# Patient Record
Sex: Female | Born: 1970
Health system: Southern US, Community
[De-identification: ages and names within clinical notes are randomized; demographics above are authoritative.]

## PROBLEM LIST (undated history)

## (undated) DIAGNOSIS — K219 Gastro-esophageal reflux disease without esophagitis: Secondary | ICD-10-CM

## (undated) DIAGNOSIS — E785 Hyperlipidemia, unspecified: Secondary | ICD-10-CM

## (undated) DIAGNOSIS — N840 Polyp of corpus uteri: Secondary | ICD-10-CM

## (undated) DIAGNOSIS — I10 Essential (primary) hypertension: Secondary | ICD-10-CM

## (undated) DIAGNOSIS — D509 Iron deficiency anemia, unspecified: Secondary | ICD-10-CM

## (undated) DIAGNOSIS — D219 Benign neoplasm of connective and other soft tissue, unspecified: Secondary | ICD-10-CM

## (undated) DIAGNOSIS — D250 Submucous leiomyoma of uterus: Secondary | ICD-10-CM

## (undated) DIAGNOSIS — D649 Anemia, unspecified: Secondary | ICD-10-CM

## (undated) DIAGNOSIS — M1990 Unspecified osteoarthritis, unspecified site: Secondary | ICD-10-CM

## (undated) DIAGNOSIS — E0590 Thyrotoxicosis, unspecified without thyrotoxic crisis or storm: Secondary | ICD-10-CM

## (undated) DIAGNOSIS — R7303 Prediabetes: Secondary | ICD-10-CM

## (undated) HISTORY — DX: Essential (primary) hypertension: I10

## (undated) HISTORY — DX: Gastro-esophageal reflux disease without esophagitis: K21.9

## (undated) HISTORY — DX: Prediabetes: R73.03

## (undated) HISTORY — DX: Thyrotoxicosis, unspecified without thyrotoxic crisis or storm: E05.90

## (undated) HISTORY — DX: Benign neoplasm of connective and other soft tissue, unspecified: D21.9

## (undated) HISTORY — DX: Anemia, unspecified: D64.9

## (undated) HISTORY — DX: Hyperlipidemia, unspecified: E78.5

## (undated) HISTORY — DX: Unspecified osteoarthritis, unspecified site: M19.90

---

## 2001-04-15 ENCOUNTER — Emergency Department (HOSPITAL_COMMUNITY): Admission: EM | Admit: 2001-04-15 | Discharge: 2001-04-15 | Payer: Self-pay | Admitting: *Deleted

## 2002-04-28 ENCOUNTER — Emergency Department (HOSPITAL_COMMUNITY): Admission: EM | Admit: 2002-04-28 | Discharge: 2002-04-28 | Payer: Self-pay | Admitting: Emergency Medicine

## 2002-07-14 ENCOUNTER — Ambulatory Visit (HOSPITAL_COMMUNITY): Admission: RE | Admit: 2002-07-14 | Discharge: 2002-07-14 | Payer: Self-pay | Admitting: *Deleted

## 2002-10-13 ENCOUNTER — Inpatient Hospital Stay (HOSPITAL_COMMUNITY): Admission: AD | Admit: 2002-10-13 | Discharge: 2002-10-15 | Payer: Self-pay | Admitting: *Deleted

## 2003-12-14 ENCOUNTER — Encounter (INDEPENDENT_AMBULATORY_CARE_PROVIDER_SITE_OTHER): Payer: Self-pay | Admitting: Specialist

## 2003-12-14 ENCOUNTER — Other Ambulatory Visit: Admission: RE | Admit: 2003-12-14 | Discharge: 2003-12-14 | Payer: Self-pay | Admitting: Obstetrics and Gynecology

## 2003-12-14 ENCOUNTER — Encounter: Admission: RE | Admit: 2003-12-14 | Discharge: 2003-12-14 | Payer: Self-pay | Admitting: Family Medicine

## 2003-12-28 ENCOUNTER — Encounter: Admission: RE | Admit: 2003-12-28 | Discharge: 2003-12-28 | Payer: Self-pay | Admitting: Family Medicine

## 2004-08-19 ENCOUNTER — Emergency Department (HOSPITAL_COMMUNITY): Admission: EM | Admit: 2004-08-19 | Discharge: 2004-08-19 | Payer: Self-pay | Admitting: Emergency Medicine

## 2005-12-05 ENCOUNTER — Emergency Department (HOSPITAL_COMMUNITY): Admission: EM | Admit: 2005-12-05 | Discharge: 2005-12-05 | Payer: Self-pay | Admitting: Emergency Medicine

## 2007-11-17 ENCOUNTER — Encounter: Admission: RE | Admit: 2007-11-17 | Discharge: 2007-11-17 | Payer: Self-pay | Admitting: Gastroenterology

## 2008-12-07 ENCOUNTER — Emergency Department (HOSPITAL_COMMUNITY): Admission: EM | Admit: 2008-12-07 | Discharge: 2008-12-07 | Payer: Self-pay | Admitting: Emergency Medicine

## 2009-04-02 IMAGING — CT CT ABDOMEN W/ CM
2 of 5 series · 17 of 46 positions shown, 19 images · IV contrast (READICAT/WATER & [ID] OMNI 300)
Comparison: None

CT ABDOMEN

CLINICAL DATA: Abdominal pain

CT ABDOMEN AND PELVIS WITH CONTRAST
TECHNIQUE: Multidetector CT imaging of the abdomen and pelvis was
performed using the standard protocol following bolus
administration of intravenous contrast.
Contrast: 100 ml Cmnipaque-8TT

[Series 3: routine abdomen · axial · 0.70mm/px · z∈[-368,+17]mm · 14 of 87 slices shown, 16 images]
[im 5/87  soft-tissue]
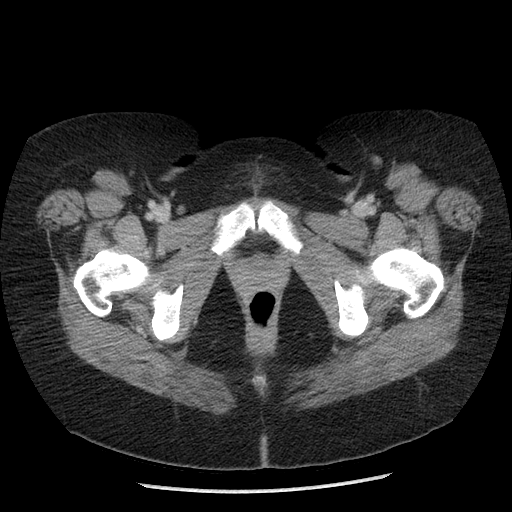
[im 5/87  bone]
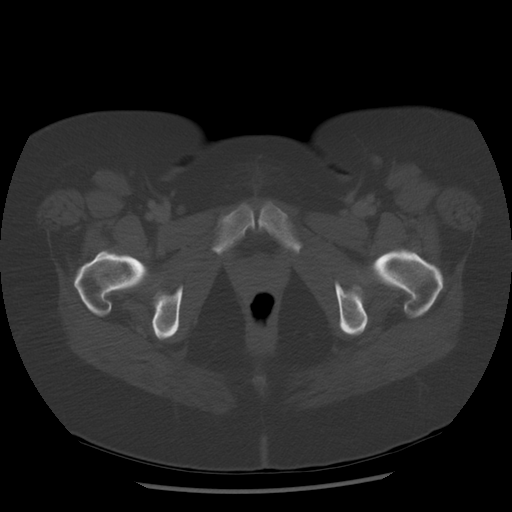
[im 10/87  soft-tissue]
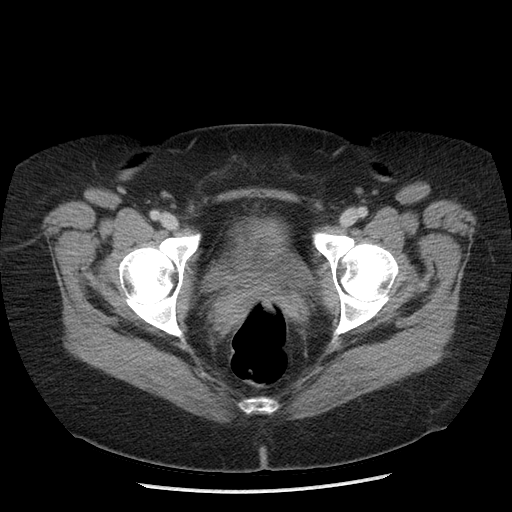
[im 20/87  soft-tissue]
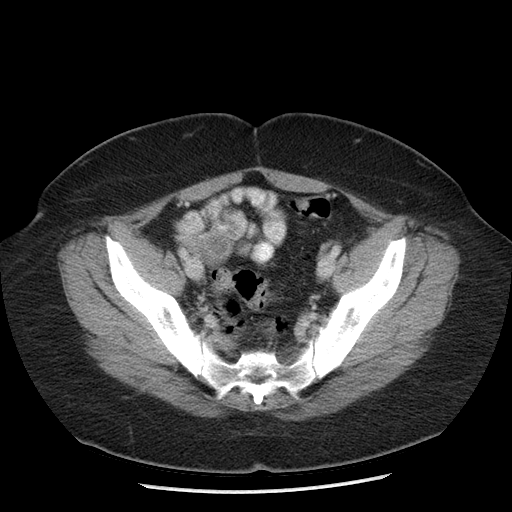
[im 24/87  soft-tissue]
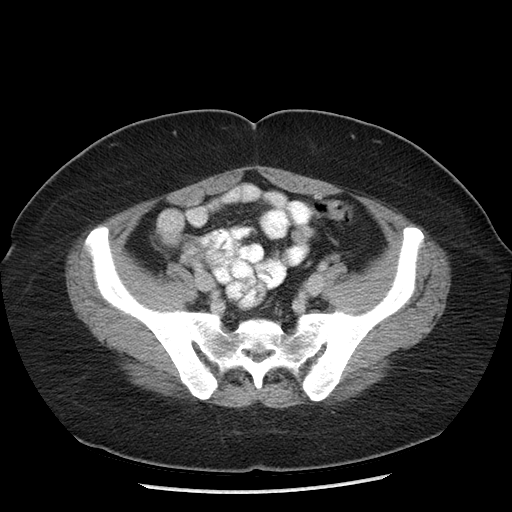
[im 29/87  soft-tissue]
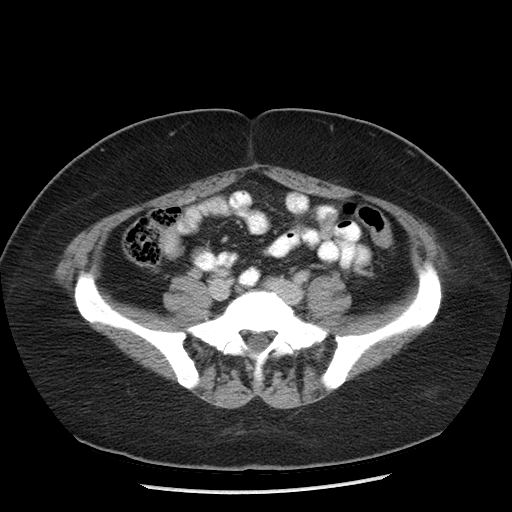
[im 34/87  soft-tissue]
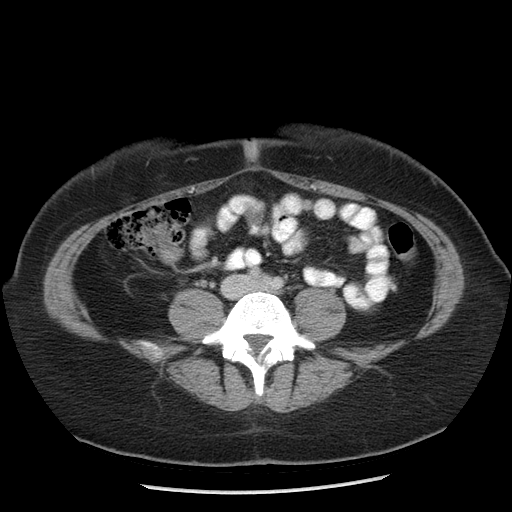
[im 39/87  soft-tissue]
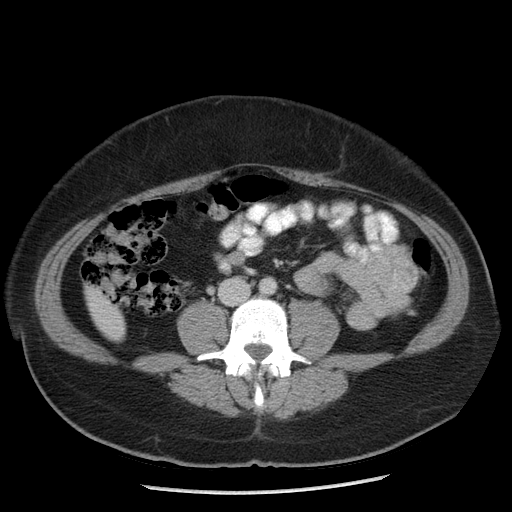
[im 48/87  soft-tissue]
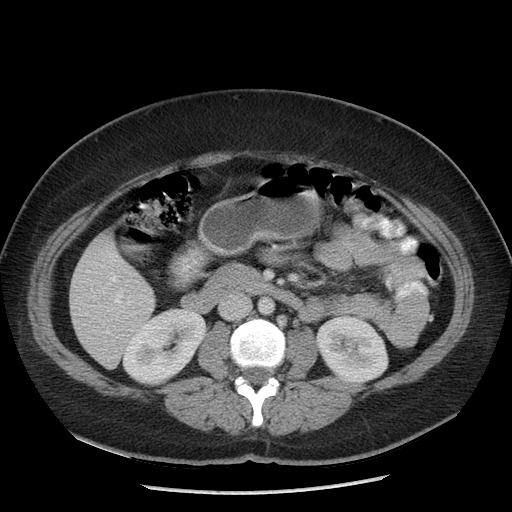
[im 53/87  soft-tissue]
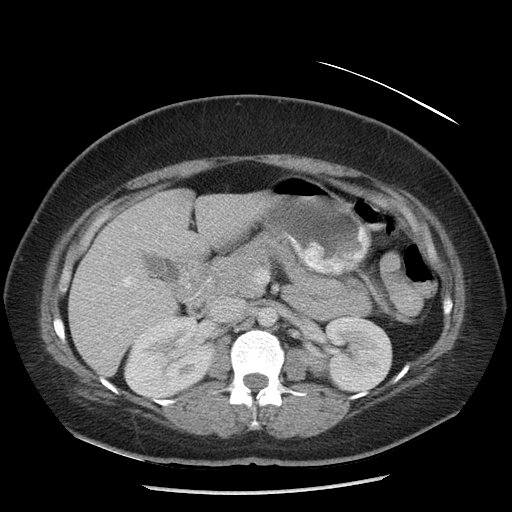
[im 53/87  bone]
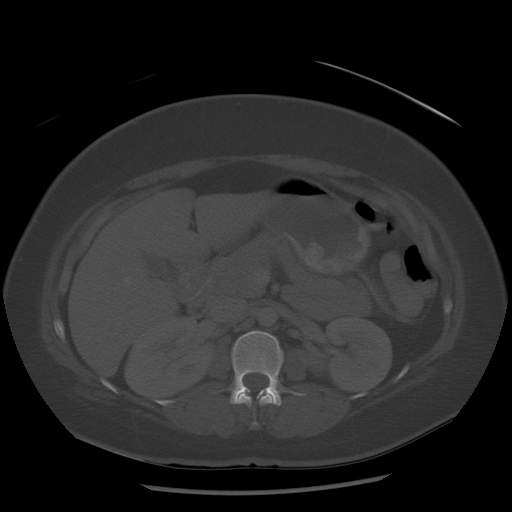
[im 58/87  soft-tissue]
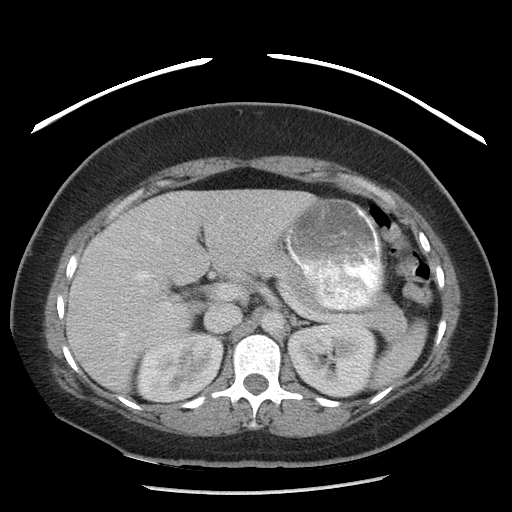
[im 63/87  soft-tissue]
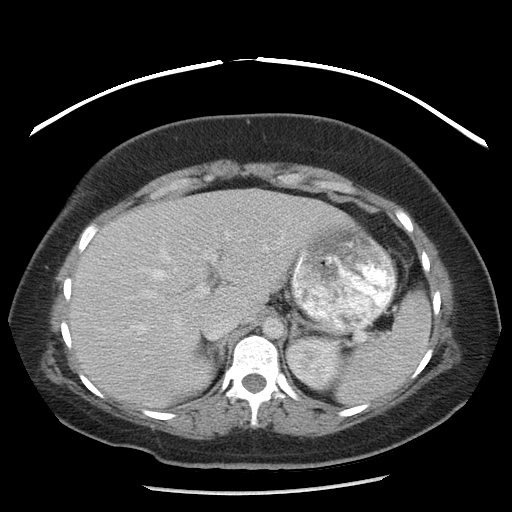
[im 67/87  soft-tissue]
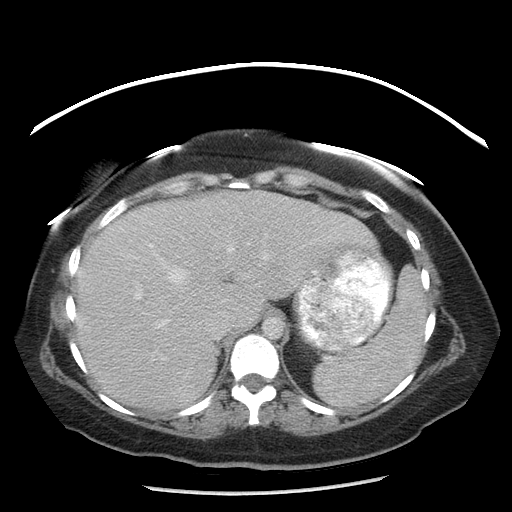
[im 77/87  soft-tissue]
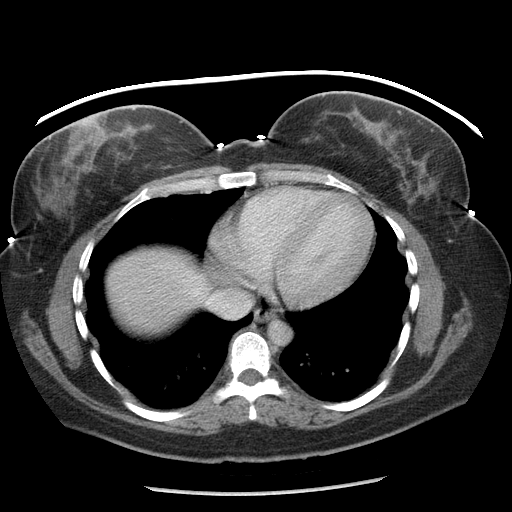
[im 82/87  soft-tissue]
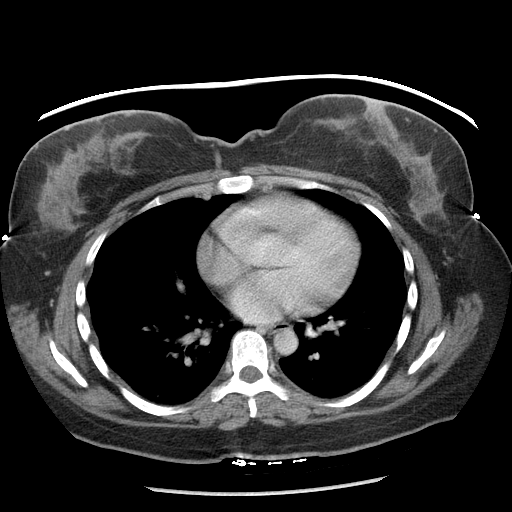

[Series 602: sagittal body · sagittal · 0.87mm/px · 3 of 145 slices shown]
[im 49/145  soft-tissue]
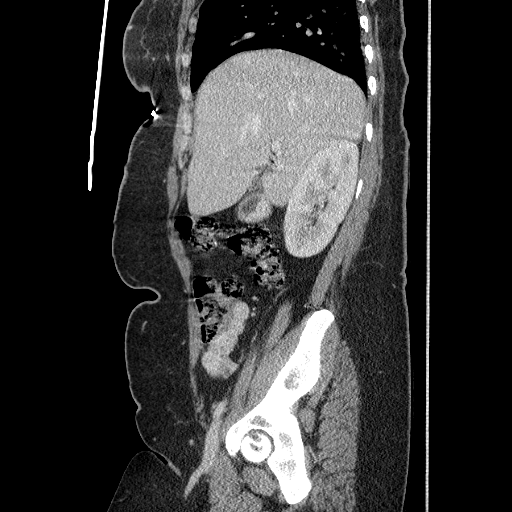
[im 65/145  soft-tissue]
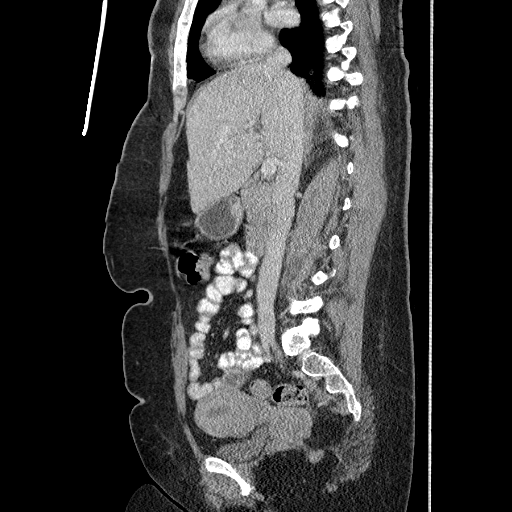
[im 81/145  soft-tissue]
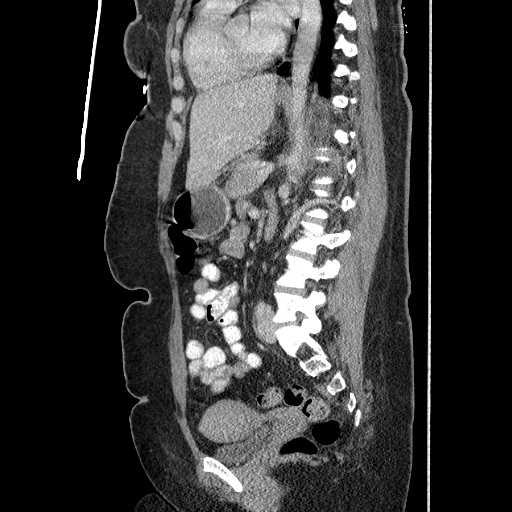

[17 of 46 positions shown; findings below may reference images not displayed]

FINDINGS: Calcified granuloma is seen in the left lower lobe.  The
liver, spleen, stomach, duodenum, pancreas, gallbladder, adrenal
glands, and kidneys have normal imaging features.  There is no
intraperitoneal free fluid.  No abdominal lymphadenopathy.
Abdominal bowel loops are normal in appearance.
IMPRESSION: Normal CT scan of the abdomen.

CT PELVIS
FINDINGS: There is no free intraperitoneal fluid.  No pelvic
sidewall lymphadenopathy.  Divergence of the endometrial canal
towards the uterine fundus is compatible with bicornuate or septate
anatomy.  There is no left adnexal mass.  2.4 cm cystic lesion is
seen in the right ovary.  Bladder is not distended.  No substantial
diverticular disease in the sigmoid colon.  The terminal ileum and
the appendix are normal.

Bone windows are unremarkable.
IMPRESSION: No CT evidence to explain this patient's history of pain.

Bicornuate versus septate uterus.

2.4 cm right ovarian cyst.  Follow up ultrasound in 6 weeks is
recommended to ensure resolution.

## 2010-02-24 ENCOUNTER — Emergency Department (HOSPITAL_COMMUNITY): Admission: EM | Admit: 2010-02-24 | Discharge: 2010-02-24 | Payer: Self-pay | Admitting: Family Medicine

## 2010-11-11 LAB — RAPID STREP SCREEN (MED CTR MEBANE ONLY): Streptococcus, Group A Screen (Direct): POSITIVE — AB

## 2014-02-03 ENCOUNTER — Encounter (HOSPITAL_COMMUNITY): Payer: Self-pay | Admitting: Emergency Medicine

## 2014-02-03 ENCOUNTER — Emergency Department (HOSPITAL_COMMUNITY)
Admission: EM | Admit: 2014-02-03 | Discharge: 2014-02-03 | Disposition: A | Discharge status: Home / Self Care | Payer: BC Managed Care – PPO | Attending: Emergency Medicine | Admitting: Emergency Medicine

## 2014-02-03 DIAGNOSIS — R112 Nausea with vomiting, unspecified: Secondary | ICD-10-CM | POA: Insufficient documentation

## 2014-02-03 DIAGNOSIS — Z79899 Other long term (current) drug therapy: Secondary | ICD-10-CM | POA: Insufficient documentation

## 2014-02-03 DIAGNOSIS — R197 Diarrhea, unspecified: Secondary | ICD-10-CM

## 2014-02-03 DIAGNOSIS — R109 Unspecified abdominal pain: Secondary | ICD-10-CM | POA: Insufficient documentation

## 2014-02-03 LAB — CBC WITH DIFFERENTIAL/PLATELET
Basophils Absolute: 0 10*3/uL (ref 0.0–0.1)
Basophils Relative: 0 % (ref 0–1)
Eosinophils Absolute: 0.1 10*3/uL (ref 0.0–0.7)
Eosinophils Relative: 1 % (ref 0–5)
HCT: 36.7 % (ref 36.0–46.0)
Hemoglobin: 11.9 g/dL — ABNORMAL LOW (ref 12.0–15.0)
Lymphocytes Relative: 25 % (ref 12–46)
Lymphs Abs: 2.9 10*3/uL (ref 0.7–4.0)
MCH: 25 pg — ABNORMAL LOW (ref 26.0–34.0)
MCHC: 32.4 g/dL (ref 30.0–36.0)
MCV: 77.1 fL — ABNORMAL LOW (ref 78.0–100.0)
Monocytes Absolute: 0.9 10*3/uL (ref 0.1–1.0)
Monocytes Relative: 8 % (ref 3–12)
Neutro Abs: 7.5 10*3/uL (ref 1.7–7.7)
Neutrophils Relative %: 66 % (ref 43–77)
Platelets: 401 10*3/uL — ABNORMAL HIGH (ref 150–400)
RBC: 4.76 MIL/uL (ref 3.87–5.11)
RDW: 15.6 % — ABNORMAL HIGH (ref 11.5–15.5)
WBC: 11.4 10*3/uL — ABNORMAL HIGH (ref 4.0–10.5)

## 2014-02-03 LAB — COMPREHENSIVE METABOLIC PANEL
ALT: 11 U/L (ref 0–35)
AST: 19 U/L (ref 0–37)
Albumin: 3.5 g/dL (ref 3.5–5.2)
Alkaline Phosphatase: 78 U/L (ref 39–117)
Anion gap: 10 (ref 5–15)
BUN: 15 mg/dL (ref 6–23)
CO2: 25 mEq/L (ref 19–32)
Calcium: 8.7 mg/dL (ref 8.4–10.5)
Chloride: 104 mEq/L (ref 96–112)
Creatinine, Ser: 0.58 mg/dL (ref 0.50–1.10)
GFR calc Af Amer: >90 mL/min (ref >90)
GFR calc non Af Amer: >90 mL/min (ref >90)
Glucose, Bld: 98 mg/dL (ref 70–99)
Potassium: 4 mEq/L (ref 3.7–5.3)
Sodium: 139 mEq/L (ref 137–147)
Total Bilirubin: 0.2 mg/dL — ABNORMAL LOW (ref 0.3–1.2)
Total Protein: 7.6 g/dL (ref 6.0–8.3)

## 2014-02-03 LAB — URINALYSIS, ROUTINE W REFLEX MICROSCOPIC
Bilirubin Urine: NEGATIVE
Glucose, UA: NEGATIVE mg/dL
Ketones, ur: NEGATIVE mg/dL
Leukocytes, UA: NEGATIVE
Nitrite: NEGATIVE
Protein, ur: NEGATIVE mg/dL
Specific Gravity, Urine: 1.015 (ref 1.005–1.030)
Urobilinogen, UA: 0.2 mg/dL (ref 0.0–1.0)
pH: 6 (ref 5.0–8.0)

## 2014-02-03 LAB — PREGNANCY, URINE: Preg Test, Ur: NEGATIVE

## 2014-02-03 LAB — URINE MICROSCOPIC-ADD ON

## 2014-02-03 LAB — LIPASE, BLOOD: Lipase: 23 U/L (ref 11–59)

## 2014-02-03 MED ORDER — SODIUM CHLORIDE 0.9 % IV BOLUS (SEPSIS)
1000.0000 mL | Freq: Once | INTRAVENOUS | Status: AC
Start: 2014-02-03 — End: 2014-02-03
  Administered 2014-02-03: 1000 mL via INTRAVENOUS

## 2014-02-03 MED ORDER — MORPHINE SULFATE 4 MG/ML IJ SOLN
6.0000 mg | Freq: Once | INTRAMUSCULAR | Status: AC
  Administered 2014-02-03: 6 mg via INTRAVENOUS
  Filled 2014-02-03: qty 2

## 2014-02-03 MED ORDER — ONDANSETRON HCL 4 MG PO TABS: 4.0000 mg | ORAL_TABLET | Freq: Four times a day (QID) | ORAL | Status: DC

## 2014-02-03 MED ORDER — ONDANSETRON HCL 4 MG/2ML IJ SOLN
4.0000 mg | Freq: Once | INTRAMUSCULAR | Status: DC
  Filled 2014-02-03: qty 2

## 2014-02-03 MED ORDER — ONDANSETRON HCL 4 MG/2ML IJ SOLN
4.0000 mg | Freq: Once | INTRAMUSCULAR | Status: AC
  Administered 2014-02-03: 4 mg via INTRAVENOUS

## 2014-02-03 NOTE — ED Notes (Signed)
Pt c/o generalized abd pain with n/v/d that started two hours prior to arrival, denies any hx of previous pain, spouse at bedside upset with delay, update given, comfort measures provided, pt expressed understanding,

## 2014-02-03 NOTE — ED Notes (Signed)
Abdominal pain, vomiting and diarrhea began 2 hours PTA.

## 2014-02-03 NOTE — ED Notes (Signed)
Dr Wilson Singer at bedside,

## 2014-02-03 NOTE — ED Provider Notes (Signed)
CSN: [id number]     Arrival date & time 02/03/14  1054 History  This chart was scribed for Elizabeth Manifold, MD by Martinique Peace, ED Scribe. The patient was seen in [facility]07. The patient's care was started at 12:23 PM.    Chief Complaint  Patient presents with  . Emesis      Patient is a 43 y.o. female presenting with vomiting. The history is provided by the patient, a relative and the spouse. No language interpreter was used.  Emesis Associated symptoms: abdominal pain and diarrhea   Associated symptoms: no chills    HPI Comments: Elizabeth Barr is a 43 y.o. female who presents to the Emergency Department complaining of severe abdominal pain onset around 9 this morning with associated vomiting and diarrhea. Pt describes the pain as strong, intermittent cramping sensation. She denies fever, chills, or urinary problems. She further denies any alcohol consumption or anyone around her being sick. Pt has no recent history of surgery and NKA.   History reviewed. No pertinent past medical history. History reviewed. No pertinent past surgical history. No family history on file. History  Substance Use Topics  . Smoking status: Never Smoker   . Smokeless tobacco: Not on file  . Alcohol Use: No   OB History   Grav Para Term Preterm Abortions TAB SAB Ect Mult Living                 Review of Systems  Constitutional: Negative for fever and chills.  Gastrointestinal: Positive for vomiting, abdominal pain and diarrhea.  Genitourinary: Negative for dysuria, hematuria, decreased urine volume and difficulty urinating.  All other systems reviewed and are negative.     Allergies  Review of patient's allergies indicates no known allergies.  Home Medications   Prior to Admission medications   Medication Sig Start Date End Date Taking? Authorizing Provider  ranitidine (ZANTAC) 300 MG tablet Take 300 mg by mouth at bedtime.   Yes Historical Provider, MD  ondansetron (ZOFRAN) 4 MG tablet  Take 1 tablet (4 mg total) by mouth every 6 (six) hours. 02/03/14   Elizabeth Manifold, MD   Triage Vitals: BP 127/90  Pulse 80  Temp(Src) 98.1 F (36.7 C) (Oral)  Resp 16  Ht 5\' 4"  (1.626 m)  Wt 170 lb (77.111 kg)  BMI 29.17 kg/m2  SpO2 100%  LMP 01/19/2014 Physical Exam  Nursing note and vitals reviewed. Constitutional: She is oriented to person, place, and time. She appears well-developed and well-nourished. No distress.  HENT:  Head: Normocephalic and atraumatic.  Eyes: Conjunctivae and EOM are normal.  Neck: Neck supple. No tracheal deviation present.  Cardiovascular: Normal rate.   Pulmonary/Chest: Effort normal. No respiratory distress.  Abdominal: Soft. She exhibits no distension. There is tenderness (diffusely). There is no guarding.  Musculoskeletal: Normal range of motion.  Neurological: She is alert and oriented to person, place, and time.  Skin: Skin is warm and dry.  Psychiatric: She has a normal mood and affect. Her behavior is normal.    ED Course  Procedures (including critical care time) DIAGNOSTIC STUDIES: Oxygen Saturation is 100% on room air, normal by my interpretation.    COORDINATION OF CARE: 12:28 PM- Treatment plan was discussed with patient who verbalizes understanding and agrees.    Labs Review Labs Reviewed  URINALYSIS, ROUTINE W REFLEX MICROSCOPIC - Abnormal; Notable for the following:    Hgb urine dipstick SMALL (*)    All other components within normal limits  CBC WITH DIFFERENTIAL -  Abnormal; Notable for the following:    WBC 11.4 (*)    Hemoglobin 11.9 (*)    MCV 77.1 (*)    MCH 25.0 (*)    RDW 15.6 (*)    Platelets 401 (*)    All other components within normal limits  COMPREHENSIVE METABOLIC PANEL - Abnormal; Notable for the following:    Total Bilirubin 0.2 (*)    All other components within normal limits  URINE MICROSCOPIC-ADD ON - Abnormal; Notable for the following:    Squamous Epithelial / LPF FEW (*)    All other components  within normal limits  PREGNANCY, URINE  LIPASE, BLOOD    Imaging Review No results found.   EKG Interpretation None     Medications  sodium chloride 0.9 % bolus 1,000 mL (0 mLs Intravenous Stopped 02/03/14 1453)  morphine 4 MG/ML injection 6 mg (6 mg Intravenous Given 02/03/14 1250)  ondansetron (ZOFRAN) injection 4 mg (4 mg Intravenous Given 02/03/14 1254)    MDM   Final diagnoses:  Nausea vomiting and diarrhea    43 year old female with nausea, vomiting diarrhea starting a few hours prior to arrival. Symptoms improved with medication. Repeat abdominal exam pretty benign. Suspect viral illness. Low suspicion for acute surgical process. Plan symptomatic tx. Return precautions discussed.   I personally preformed the services scribed in my presence. The recorded information has been reviewed is accurate. Elizabeth Manifold, MD.    Elizabeth Manifold, MD 02/13/14 [phone]

## 2014-02-03 NOTE — Discharge Instructions (Signed)
Nausea and Vomiting °Nausea is a sick feeling that often comes before throwing up (vomiting). Vomiting is a reflex where stomach contents come out of your mouth. Vomiting can cause severe loss of body fluids (dehydration). Children and elderly adults can become dehydrated quickly, especially if they also have diarrhea. Nausea and vomiting are symptoms of a condition or disease. It is important to find the cause of your symptoms. °CAUSES  °· Direct irritation of the stomach lining. This irritation can result from increased acid production (gastroesophageal reflux disease), infection, food poisoning, taking certain medicines (such as nonsteroidal anti-inflammatory drugs), alcohol use, or tobacco use. °· Signals from the brain. These signals could be caused by a headache, heat exposure, an inner ear disturbance, increased pressure in the brain from injury, infection, a tumor, or a concussion, pain, emotional stimulus, or metabolic problems. °· An obstruction in the gastrointestinal tract (bowel obstruction). °· Illnesses such as diabetes, hepatitis, gallbladder problems, appendicitis, kidney problems, cancer, sepsis, atypical symptoms of a heart attack, or eating disorders. °· Medical treatments such as chemotherapy and radiation. °· Receiving medicine that makes you sleep (general anesthetic) during surgery. °DIAGNOSIS °Your caregiver may ask for tests to be done if the problems do not improve after a few days. Tests may also be done if symptoms are severe or if the reason for the nausea and vomiting is not clear. Tests may include: °· Urine tests. °· Blood tests. °· Stool tests. °· Cultures (to look for evidence of infection). °· X-rays or other imaging studies. °Test results can help your caregiver make decisions about treatment or the need for additional tests. °TREATMENT °You need to stay well hydrated. Drink frequently but in small amounts. You may wish to drink water, sports drinks, clear broth, or eat frozen  ice pops or gelatin dessert to help stay hydrated. When you eat, eating slowly may help prevent nausea. There are also some antinausea medicines that may help prevent nausea. °HOME CARE INSTRUCTIONS  °· Take all medicine as directed by your caregiver. °· If you do not have an appetite, do not force yourself to eat. However, you must continue to drink fluids. °· If you have an appetite, eat a normal diet unless your caregiver tells you differently. °¨ Eat a variety of complex carbohydrates (rice, wheat, potatoes, bread), lean meats, yogurt, fruits, and vegetables. °¨ Avoid high-fat foods because they are more difficult to digest. °· Drink enough water and fluids to keep your urine clear or pale yellow. °· If you are dehydrated, ask your caregiver for specific rehydration instructions. Signs of dehydration may include: °¨ Severe thirst. °¨ Dry lips and mouth. °¨ Dizziness. °¨ Dark urine. °¨ Decreasing urine frequency and amount. °¨ Confusion. °¨ Rapid breathing or pulse. °SEEK IMMEDIATE MEDICAL CARE IF:  °· You have blood or brown flecks (like coffee grounds) in your vomit. °· You have black or bloody stools. °· You have a severe headache or stiff neck. °· You are confused. °· You have severe abdominal pain. °· You have chest pain or trouble breathing. °· You do not urinate at least once every 8 hours. °· You develop cold or clammy skin. °· You continue to vomit for longer than 24 to 48 hours. °· You have a fever. °MAKE SURE YOU:  °· Understand these instructions. °· Will watch your condition. °· Will get help right away if you are not doing well or get worse. °Document Released: 07/20/2005 Document Revised: 10/12/2011 Document Reviewed: 12/17/2010 °ExitCare® Patient Information ©2015 [employer]. This information is not intended   to replace advice given to you by your health care provider. Make sure you discuss any questions you have with your health care provider. ° ° °Emergency Department Resource Guide °1) Find a  Doctor and Pay Out of Pocket °Although you won't have to find out who is covered by your insurance plan, it is a good idea to ask around and get recommendations. You will then need to call the office and see if the doctor you have chosen will accept you as a new patient and what types of options they offer for patients who are self-pay. Some doctors offer discounts or will set up payment plans for their patients who do not have insurance, but you will need to ask so you aren't surprised when you get to your appointment. ° °2) Contact Your Local Health Department °Not all health departments have doctors that can see patients for sick visits, but many do, so it is worth a call to see if yours does. If you don't know where your local health department is, you can check in your phone book. The CDC also has a tool to help you locate your state's health department, and many state websites also have listings of all of their local health departments. ° °3) Find a Walk-in Clinic °If your illness is not likely to be very severe or complicated, you may want to try a walk in clinic. These are popping up all over the country in pharmacies, drugstores, and shopping centers. They're usually staffed by nurse practitioners or physician assistants that have been trained to treat common illnesses and complaints. They're usually fairly quick and inexpensive. However, if you have serious medical issues or chronic medical problems, these are probably not your best option. ° °No Primary Care Doctor: °- Call [facility] at  832-8000 - they can help you locate a primary care doctor that  accepts your insurance, provides certain services, etc. °- Physician Referral Service- 1-800-533-3463 ° °Chronic Pain Problems: °Organization         Address  Phone   Notes  °[facility]  (336) 297-2271 Patients need to be referred by their primary care doctor.  ° °Medication Assistance: °Organization         Address  Phone    Notes  °[place] [facility] [address] °[place], [place] [address] (336) 641-8030 --Must be a resident of [place] °-- Must have NO insurance coverage whatsoever (no Medicaid/ Medicare, etc.) °-- The pt. MUST have a primary care doctor that directs their care regularly and follows them in the community °  °[facility]  (866) 331-1348   °[employer]  (888) 892-1162   ° °Agencies that provide inexpensive medical care: °Organization         Address  Phone   Notes  °[facility]  (336) 832-8035   °[facility]    (336) 832-7272   °[facility] [address] °[place], [place] [address] (336) 832-4777   °[facility] [address], °[place] (336) 271-4999   °[facility]    (336) 373-0678   °[facility]    (336) 272-1050   °[facility] ° [address], [place] Phone:  (336) 832-4444, Fax:  (336) 832-4440 Hours of Operation:  9 am - 6 pm, M-F.  Also accepts Medicaid/Medicare and self-pay.  °[facility] ° [address], [place]   Phone: (336) [phone], Fax: (336) [phone]. Hours of Operation:  8:30 am - 5:30 pm, M-F.  Also accepts Medicaid and self-pay.  °[facility] [place] [address], [place] Phone: (336) 878-6027   °[facility] [address], [place], [place] (336)723-1848, Ext. 123 Mondays & Thursdays: 7-9 AM.  First 15 patients are seen on a first come, first serve basis. °  ° °Medicaid-accepting [place] Providers: ° °Organization         Address  Phone   Notes  °[facility] [address], [place] (336) 641-2100 Also accepts self-pay patients.  °[facility] [address], [place] ° (336) 856-9996   °[facility] [address], [place] (336) 288-8857   °[facility] [address] [place] [address], [place] (336) 299-7000   °Veita Bland [address], [place]  ° (336) [phone] Only accepts [place] Access Medicaid patients after they have their name applied to their card.  ° °Self-Pay (no insurance) in [place]: ° °Organization         Address  Phone   Notes  °Sickle Cell Patients, [facility] [address], [place] (336) 832-1970   °[facility] [address], [place] (336) 832-4400   °[facility] [place] [facility] ° [address],  (336) 992-4800   °[facility]/Dr. Osei-Bonsu ° [address] [place] [address], [place] or [address], [place] (336) [phone] Phone number for both [place] and [place] locations is the same.  °[facility] [address], [place] (336) 299-0000   °[facility] [address] [place] [address], [place] or [address] (336) 852-7530 °(336) 878-2260   °[facility] [address], [place] (336) 350-1642, phone; (336) 294-5005, fax Sees patients 1st and 3rd Saturday of every month.  Must not qualify for public or private insurance (i.e. Medicaid, Medicare, [place] Health Choice, Veterans' Benefits) • Household income should be no more than 200% of the poverty level •The clinic cannot treat you if you are pregnant or think you are pregnant • Sexually transmitted diseases are not treated at the clinic.  ° ° °Dental Care: °Organization         Address  Phone  Notes  °[facility] [address], [place] (336) 641-6152 Accepts children up to age 21 who are enrolled in Medicaid or [place] Health Choice; pregnant women with a Medicaid card; and children who have applied for Medicaid or [place] Health Choice, but were declined, whose parents can pay a reduced fee at time of service.  °[facility] [place]  [address], [place] (336) 641-7733 Accepts children up to age 21 who are enrolled in Medicaid or [place] Health Choice; pregnant women with a Medicaid card; and children who have applied for Medicaid or [place] Health Choice, but were declined, whose parents can pay a reduced fee at time of service.  °[facility] ° [address], [place] (336) 641-4533 Patients are seen by appointment only. Walk-ins are not accepted. [facility] will see patients 18 years of age and older. °Monday - Tuesday (8am-5pm) °Most Wednesdays (8:30-5pm) °$30 per visit, cash only  °[facility] ° [address], [place] (336) [phone] Patients are seen by appointment only. Walk-ins are not accepted. [facility] will see patients 18 years of age and older. °One Wednesday Evening (Monthly: Volunteer Based).  $30 per visit, cash only  °[facility]  (919) 537-3737 for adults; Children under age 4, call Graduate Pediatric Dentistry at ([phone]) [phone]. Children aged 4-14, please call ([phone]) [phone] to request a pediatric application. ° Dental services are provided in all areas of dental care including fillings, crowns and bridges, complete and partial dentures, implants, gum treatment, root canals, and extractions. Preventive care is also provided. Treatment is provided to both adults and children. °Patients are selected via a lottery and there is often a waiting list. °  °[facility] [address], °[place] ° (336) 763-8833 [email or web address] °  °[facility] [address], [place], [place] (336)723-1848, Ext. 123 Second and Fourth Thursday of each month, opens at 6:30 AM; Clinic ends at 9 AM.  Patients are seen on a first-come first-served basis, and a limited number are seen during each clinic.  ° °[facility] ° [address], [place], [place] (336) 723-7904   Eligibility Requirements °You must have lived in  [place], [place], or [place] counties for at least the last three months. °  You cannot be eligible for state or federal sponsored healthcare insurance, including Veterans Administration, Medicaid, or Medicare. °  You generally cannot be eligible for healthcare insurance through your employer.  °  How to apply: °Eligibility screenings are held every Tuesday and Wednesday afternoon from 1:00 pm until 4:00 pm. You do not need an appointment for the interview!  °[facility] [address], [place], [place] 336-631-2330   °[facility]  336-342-8273   °[place] [facility]  336-703-3100   °[facility]  336-570-6415   ° °Behavioral Health Resources in the Community: °Intensive Outpatient Programs °Organization         Address  Phone  Notes  °[facility] [address], [place], [place] 336-878-6098   °[facility] [address], [place], [place] 336-832-9800   °[facility] [address], [place], [place] ° 336-882-2125   °[facility] [address],  °[place], [place] 1-800-853-5163 or 336-641-4981   °Substance Abuse Resources °Organization         Address  Phone  Notes  °[facility]  336-882-2125   °[facility]  336-784-9470   °[facility]  336-285-9073   °[facility]  336-845-3988   °Residential & Outpatient Substance Abuse Program  1-800-659-3381   °Psychological Services °Organization         Address  Phone  Notes  °[facility]  336- 832-9600   °[facility]  336- 378-7881   °[facility] [address], [place] 1-800-853-5163 or 336-641-4981   ° °Mobile Crisis Teams °Organization         Address  Phone  Notes  °[facility], Mobile Crisis Care Unit  1-877-626-1772   °Assertive °[facility] ° [address] [place], [place] 336-834-9664   °Sharon DeEsch [address] °[place] 336-554-5454   ° °Self-Help/Support Groups °Organization         Address  Phone             Notes  °[facility] - variety of   support groups  336- 373-1402 Call for more information  °Narcotics Anonymous (NA), [facility] [address], °[place]  2 meetings at this location  ° °Residential Treatment Programs °Organization         Address  Phone  Notes  °[facility] [address],    °[place]  1-866-801-8205   °[facility] ° [address], [place], [place] 704-293-8524   °[facility] [address], [place] 336-845-3988 Admissions: 8am-3pm M-F  °[facility] [address]    °[place], [place] 336-841-1104   °[facility] [address], [place], [place] 336-379-7146   °[facility] [address]  °[place], [place] 336-285-9073   °[facility] - Intensive Outpatient [address], [place], [place] 336-852-3033   °[facility] [address]  °[place], [place] 1-877-615-2722 or 336-784-9470   °[facility] [address] [place], [place] 336-227-7417 Accepts Medicaid  °[facility] [address]  ° [place] 1-800-659-3381 Substance Abuse/Addiction Treatment  ° °[place] Behavioral Health Resources °Organization         Address  Phone  Notes  °[facility]  (888) 581-9988   °Julie Brannon, PhD [address], [place]   (336) 349-5553 or (336) 951-0000   °[facility]   [address] °[place], [place] (336) 349-4454   °[facility] [address], [place], [place] (336) 342-8316 Insurance/Medicaid/sponsorship through [facility]  °[facility] [address]                                    [place], [place] (336) 342-8316 Therapy/tele-psych/case  °[facility] [address]  ° [place], [place] (336)  349-2233    °Dr. Arfeen  (336) 349-4544   °[facility] [place]  [employer] [place] [facility] 1) [address], [place] °2) [address], [place] °3)  [address], [place] (336) 349-3220 °(336) 342-7768 ° °(336) 342-8140   °[place] Child Abuse [facility] (336) 342-1394 or (336) 342-3537 (After Hours)    ° ° ° °

## 2016-02-29 ENCOUNTER — Ambulatory Visit (INDEPENDENT_AMBULATORY_CARE_PROVIDER_SITE_OTHER): Payer: BLUE CROSS/BLUE SHIELD | Admitting: Family Medicine

## 2016-02-29 VITALS — BP 130/82 | HR 74 | Temp 97.2°F | Resp 18 | Ht 64.0 in | Wt 184.0 lb

## 2016-02-29 DIAGNOSIS — K219 Gastro-esophageal reflux disease without esophagitis: Secondary | ICD-10-CM | POA: Diagnosis not present

## 2016-02-29 DIAGNOSIS — D72829 Elevated white blood cell count, unspecified: Secondary | ICD-10-CM

## 2016-02-29 DIAGNOSIS — N920 Excessive and frequent menstruation with regular cycle: Secondary | ICD-10-CM | POA: Diagnosis not present

## 2016-02-29 DIAGNOSIS — R739 Hyperglycemia, unspecified: Secondary | ICD-10-CM

## 2016-02-29 DIAGNOSIS — R197 Diarrhea, unspecified: Secondary | ICD-10-CM | POA: Diagnosis not present

## 2016-02-29 DIAGNOSIS — R112 Nausea with vomiting, unspecified: Secondary | ICD-10-CM | POA: Diagnosis not present

## 2016-02-29 DIAGNOSIS — R319 Hematuria, unspecified: Secondary | ICD-10-CM

## 2016-02-29 DIAGNOSIS — R1013 Epigastric pain: Secondary | ICD-10-CM

## 2016-02-29 LAB — POCT CBC
GRANULOCYTE PERCENT: 63.4 % (ref 37–80)
HEMATOCRIT: 35.6 % — AB (ref 37.7–47.9)
Hemoglobin: 11.9 g/dL — AB (ref 12.2–16.2)
Lymph, poc: 3.7 — AB (ref 0.6–3.4)
MCH, POC: 24.2 pg — AB (ref 27–31.2)
MCHC: 33.4 g/dL (ref 31.8–35.4)
MCV: 72.5 fL — AB (ref 80–97)
MID (CBC): 0.6 (ref 0–0.9)
MPV: 7.6 fL (ref 0–99.8)
POC GRANULOCYTE: 7.4 — AB (ref 2–6.9)
POC LYMPH PERCENT: 31.5 %L (ref 10–50)
POC MID %: 5.1 % (ref 0–12)
Platelet Count, POC: 378 10*3/uL (ref 142–424)
RBC: 4.91 M/uL (ref 4.04–5.48)
RDW, POC: 15.7 %
WBC: 11.7 10*3/uL — AB (ref 4.6–10.2)

## 2016-02-29 LAB — POCT URINALYSIS DIP (MANUAL ENTRY)
Bilirubin, UA: NEGATIVE
GLUCOSE UA: NEGATIVE
Ketones, POC UA: NEGATIVE
Leukocytes, UA: NEGATIVE
Nitrite, UA: NEGATIVE
Protein Ur, POC: NEGATIVE
Spec Grav, UA: 1.015
UROBILINOGEN UA: 0.2
pH, UA: 5.5

## 2016-02-29 LAB — COMPLETE METABOLIC PANEL WITH GFR
ALBUMIN: 4.2 g/dL (ref 3.6–5.1)
ALT: 10 U/L (ref 6–29)
AST: 12 U/L (ref 10–30)
Alkaline Phosphatase: 76 U/L (ref 33–115)
BILIRUBIN TOTAL: 0.3 mg/dL (ref 0.2–1.2)
BUN: 16 mg/dL (ref 7–25)
CALCIUM: 9.2 mg/dL (ref 8.6–10.2)
CO2: 26 mmol/L (ref 20–31)
CREATININE: 0.61 mg/dL (ref 0.50–1.10)
Chloride: 104 mmol/L (ref 98–110)
GFR, Est Non African American: >89 mL/min (ref >=60)
Glucose, Bld: 97 mg/dL (ref 65–99)
Potassium: 4.2 mmol/L (ref 3.5–5.3)
Sodium: 138 mmol/L (ref 135–146)
TOTAL PROTEIN: 7.5 g/dL (ref 6.1–8.1)

## 2016-02-29 LAB — LIPASE: Lipase: 14 U/L (ref 7–60)

## 2016-02-29 LAB — POCT GLYCOSYLATED HEMOGLOBIN (HGB A1C): Hemoglobin A1C: 6

## 2016-02-29 LAB — TSH: TSH: 1.08 m[IU]/L

## 2016-02-29 LAB — POC MICROSCOPIC URINALYSIS (UMFC)

## 2016-02-29 LAB — POCT URINE PREGNANCY: Preg Test, Ur: NEGATIVE

## 2016-02-29 LAB — GLUCOSE, POCT (MANUAL RESULT ENTRY): POC Glucose: 94 mg/dl (ref 70–99)

## 2016-02-29 MED ORDER — OMEPRAZOLE 20 MG PO CPDR: 20.0000 mg | DELAYED_RELEASE_CAPSULE | Freq: Every day | ORAL | 1 refills | Status: DC

## 2016-02-29 NOTE — Progress Notes (Signed)
Subjective:  By signing my name below, I, Moises Blood, attest that this documentation has been prepared under the direction and in the presence of Merri Ray, MD. Electronically Signed: Moises Blood, [place]. 02/29/2016 , 9:04 AM .  Patient was seen in Room 4 .   Patient ID: Elizabeth Barr, female    DOB: [birth year], 45 y.o.   MRN: [id number] Chief Complaint  Patient presents with  . Emesis    After eating. x1week  . Diarrhea  . Other    Pt requesting A1C   HPI Elizabeth Barr is a 45 y.o. female Patient is having nausea, vomiting and diarrhea. She states she is having acid reflux with abdominal pain that started over the past week while she was working. She's had 2 episodes of vomiting, last time being 3 days ago and diarrhea about 4-5 times a day; neither in the recent days, but still having abdominal pain. She's been taking omeprazole 20mg , last taken yesterday, and she only takes it when she feels acid reflux. She denies fever.   She also requested hemoglobin A1c today. She notes her work checked her sugar in October and informed her it was elevated. They recommended her to work on lowering it. She is unsure if she has diabetes or not.   Patient also reports having heavier periods that last up to 10 days which started this year. Her last period was July 2nd. She denies abnormal vaginal bleeding, urinary frequency, or dysuria.   Patient's primary language is Spanish, and her daughter helped translate for the patient in the room; understanding expressed.   There are no active problems to display for this patient.  Past Medical History:  Diagnosis Date  . Anemia   . Arthritis   . Hyperlipidemia    Past Surgical History:  Procedure Laterality Date  . CESAREAN SECTION     No Known Allergies Prior to Admission medications   Medication Sig Start Date End Date Taking? Authorizing Provider  ondansetron (ZOFRAN) 4 MG tablet Take 1 tablet (4 mg total) by mouth every 6  (six) hours. 02/03/14   [name], MD  ranitidine (ZANTAC) 300 MG tablet Take 300 mg by mouth at bedtime.    Historical Provider, MD   Social History   Social History  . Marital status: Single    Spouse name: N/A  . Number of children: N/A  . Years of education: N/A   Occupational History  . Not on file.   Social History Main Topics  . Smoking status: Never Smoker  . Smokeless tobacco: Not on file  . Alcohol use No  . Drug use: Unknown  . Sexual activity: Not on file   Other Topics Concern  . Not on file   Social History Narrative  . No narrative on file   Review of Systems  Constitutional: Negative for chills, fatigue and fever.  Gastrointestinal: Positive for abdominal pain, diarrhea, nausea and vomiting.  Genitourinary: Positive for menstrual problem. Negative for dysuria, frequency, hematuria, urgency and vaginal bleeding.       Objective:   Physical Exam  Constitutional: She is oriented to person, place, and time. She appears well-developed and well-nourished. No distress.  HENT:  Head: Normocephalic and atraumatic.  Eyes: EOM are normal. Pupils are equal, round, and reactive to light.  Neck: Neck supple.  Cardiovascular: Normal rate, regular rhythm and normal heart sounds.   No murmur heard. Pulmonary/Chest: Effort normal and breath sounds normal. No respiratory distress.  Abdominal: There is  tenderness in the epigastric area and suprapubic area. There is no CVA tenderness, no tenderness at McBurney's point and negative Murphy's sign.  Minimal tenderness over epigastrium Suprapubic tenderness, R>L  Musculoskeletal: Normal range of motion.  Neurological: She is alert and oriented to person, place, and time.  Skin: Skin is warm and dry.  Psychiatric: She has a normal mood and affect. Her behavior is normal.  Nursing note and vitals reviewed.   Vitals:   02/29/16 0834  BP: 130/82  Pulse: 74  Resp: 18  Temp: 97.2 F (36.2 C)  TempSrc: Oral  SpO2: 95%    Weight: 184 lb (83.5 kg)  Height: 5\' 4"  (1.626 m)   Results for orders placed or performed in visit on 02/29/16  POCT CBC  Result Value Ref Range   WBC 11.7 (A) 4.6 - 10.2 K/uL   Lymph, poc 3.7 (A) 0.6 - 3.4   POC LYMPH PERCENT 31.5 10 - 50 %L   MID (cbc) 0.6 0 - 0.9   POC MID % 5.1 0 - 12 %M   POC Granulocyte 7.4 (A) 2 - 6.9   Granulocyte percent 63.4 37 - 80 %G   RBC 4.91 4.04 - 5.48 M/uL   Hemoglobin 11.9 (A) 12.2 - 16.2 g/dL   HCT, POC 35.6 (A) 37.7 - 47.9 %   MCV 72.5 (A) 80 - 97 fL   MCH, POC 24.2 (A) 27 - 31.2 pg   MCHC 33.4 31.8 - 35.4 g/dL   RDW, POC 15.7 %   Platelet Count, POC 378 142 - 424 K/uL   MPV 7.6 0 - 99.8 fL  POCT glucose (manual entry)  Result Value Ref Range   POC Glucose 94 70 - 99 mg/dl  POCT glycosylated hemoglobin (Hb A1C)  Result Value Ref Range   Hemoglobin A1C 6.0   POCT urinalysis dipstick  Result Value Ref Range   Color, UA yellow yellow   Clarity, UA clear clear   Glucose, UA negative negative   Bilirubin, UA negative negative   Ketones, POC UA negative negative   Spec Grav, UA 1.015    Blood, UA moderate (A) negative   pH, UA 5.5    Protein Ur, POC negative negative   Urobilinogen, UA 0.2    Nitrite, UA Negative Negative   Leukocytes, UA Negative Negative  POCT Microscopic Urinalysis ([facility])  Result Value Ref Range   WBC,UR,HPF,POC None None WBC/hpf   RBC,UR,HPF,POC Few (A) None RBC/hpf   Bacteria Few (A) None, Too numerous to count   Mucus Present (A) Absent   Epithelial Cells, UR Per Microscopy Few (A) None, Too numerous to count cells/hpf  POCT urine pregnancy  Result Value Ref Range   Preg Test, Ur Negative Negative       Assessment & Plan:    Elizabeth Barr is a 45 y.o. female Abdominal pain, epigastric - Plan: COMPLETE METABOLIC PANEL WITH GFR, POCT urinalysis dipstick, POCT Microscopic Urinalysis ([facility]), Lipase, POCT urine pregnancy, omeprazole (PRILOSEC) 20 MG capsule Non-intractable vomiting with nausea, vomiting  of unspecified type - Plan: POCT urinalysis dipstick, POCT Microscopic Urinalysis ([facility]), Lipase Diarrhea, unspecified type Gastroesophageal reflux disease, esophagitis presence not specified - Plan: H. pylori breath test, omeprazole (PRILOSEC) 20 MG capsule  -Abdominal pain after nausea, vomiting, diarrhea, now improved with less/resolved nausea vomiting and diarrhea. Still some epigastric tenderness. Possible viral syndrome versus GERD/gastritis.  -Appears hydrated, and as improving, we'll continue symptomatic care.  -Borderline leukocytosis, but afebrile, and symptoms improved. Recheck within the  next 1 week if not significantly improved, or sooner if worse as may need CT scanning.  -Hematuria without other signs of urinary tract infection. Urine culture pending.  -Check H. pylori, CMP, lipase check for less likely pancreatitis or cholecystitis.  Hyperglycemia - Plan: POCT glucose (manual entry), POCT glycosylated hemoglobin (Hb A1C)  -Prediabetes. Diet handout given. Recheck levels next 3 months  Menorrhagia with regular cycle - Plan: POCT CBC, POCT urine pregnancy, TSH  -Over past year. Check TSH, return to discuss further.  Hematuria - Plan: Urine culture  -As above, urine culture pending.  Leukocytosis  -As above, plan on repeat 1 week, sooner if any increased abdominal pain, fevers, or other worsening symptoms.  Meds ordered this encounter  Medications  . omeprazole (PRILOSEC) 20 MG capsule    Sig: Take 1 capsule (20 mg total) by mouth daily.    Dispense:  30 capsule    Refill:  1   Patient Instructions   Continue omeprazole once per day, but avoid foods listed below that can worsen heartburn and reflux. I will check for a bacteria that can cause stomach ulcers. If this is positive, you may need to take an antibiotic. I will also check some kidney tests, liver tests, and pancreas tests, but these are unlikely the cause of your symptoms. There was some blood noted on your urine  test, but I do not see other signs of infection. We will need to repeat that test next week.   Your infection fighting cells were also mildly elevated. Recheck next week with repeat blood count, but if you have any fevers, worsening abdominal pain, or new or worsening symptoms in the meantime, return here or emergency room.  Your acid reflux and abdominal pain should continue to improve. If nausea and vomiting or diarrhea return, or worsening symptoms, return for recheck here or other medical provider.  For the heavier menses, I would like you to return to discuss this further. However I did check a thyroid test today and can discuss other possible causes and other workup at a follow-up visit.  Your blood sugar test does indicate prediabetes. See diet below, work on weight loss and have this test rechecked in 3 months.   Plan de alimentacin para la prediabetes (Prediabetes Eating Plan) La prediabetes, tambin llamada intolerancia a la glucosa o alteracin de la glucosa en ayunas, es una afeccin que eleva los niveles de azcar en la sangre (glucemia) por encima de lo normal. Seguir una dieta saludable puede ayudar a mantener la prediabetes bajo control, y tambin reduce el riesgo de tener diabetes tipo2 y cardiopata, que es ms alto en las personas que tienen esta afeccin. Junto con la actividad fsica habitual, una dieta saludable:  Promueve la prdida de [place].  Ayuda a Environmental consultant de Dispensing optician.  Ayuda a mejorar la forma en que el organismo [place] la insulina. QU DEBO SABER ACERCA DE ESTE PLAN DE [place]?  Use el ndice glucmico (IG) para planificar las comidas. El ndice le informa con qu rapidez un alimento elevar su nivel de azcar en la sangre. Elija los alimentos con bajo IG. Estos tardan ms tiempo en subir el nivel de azcar en la sangre.  Preste mucha atencin a la cantidad de hidratos de carbono que hay en los alimentos que consume. Los hidratos de  carbono [employer] niveles de Dispensing optician.  Lleve un registro de la cantidad de caloras que ingiere. Ingerir la cantidad Advice worker de caloras  lo ayudar a Electrical engineer. Bajar alrededor del 7por ciento del peso inicial puede ayudar a Product/process development scientist la diabetes tipo2.  Tal vez deba seguir Web designer. Esta incluye una gran cantidad de verduras, carnes magras o pescado, cereales integrales, frutas, as como aceites y grasas saludables. QU ALIMENTOS PUEDO COMER? Cereales Cereales integrales, como panes, galletas, cereales y pastas de salvado o integrales. Avena sin azcar. Trigo burgol. Cebada. Quinua. Arroz integral. Tortillas o tacos de harina de maz o de salvado. [place] Valeda Malm. [place]. Guisantes. Remolachas. Coliflor. Repollo. Brcoli. Zanahorias. Tomates. [place]. [place]in [place]. Hierbas. Pimientos. Cebollas. Pepinos. Repollitos de Bruselas. Frutas Frutos rojos. Bananas. Manzanas. Naranjas. Uvas. Papaya. Mango. [place]. Kiwi. Pomelo. Cerezas. Carnes y otras fuentes de protenas [place]iscos. Carnes [place], entre ellas, pollo y [place] o cortes magros de carne de cerdo y de [place]. Tofu. Huevos. Los frutos secos. Frijoles. Lcteos Productos lcteos descremados o semidescremados, como yogur, queso cottage y [place]. [employer]. T. Caf. Gaseosas sin azcar o dietticas. Agua de [place]. Leche. Productos alternativos [place], como leche de soja o de [place]. Condimentos [place]. Salsa de pepinillos. [place] con bajo contenido de [place] y de Location manager. Salsa barbacoa con bajo contenido de grasa y de azcar. [place]onesa sin grasa o con bajo contenido de [place]. Dulces y postres [place] sin azcar o con bajo contenido de [place]. Helados y otros dulces congelados sin azcar o con bajo contenido de [place]. Grasas y Medical laboratory scientific officer. Nueces. Aceite de oliva. Los artculos mencionados arriba pueden no ser [employer] de las bebidas o los alimentos recomendados. Comunquese con  el nutricionista para conocer ms opciones. QU ALIMENTOS NO SE RECOMIENDAN? Cereales Productos a base de [place] y de [place], como panes, pastas, bocadillos y cereales. Bebidas Bebidas azucaradas, como t helado y gaseosas con Location manager. Dulces y postres Productos de [place], [place] tortas, [place], [place], Museum/gallery exhibitions officer y tarta de [place]. Los artculos mencionados arriba pueden no ser [employer] de las bebidas y los alimentos que se Higher education careers adviser. Comunquese con el nutricionista para obtener ms informacin.   Esta informacin no tiene Marine scientist el consejo del mdico. Asegrese de hacerle al mdico cualquier pregunta que tenga.   Document Released: 04/10/2015 Elsevier Interactive Patient Education 2016 [employer].   Hematuria - Adultos (Hematuria, Adult) La hematuria es la presencia de sangre en la orina. La causa puede ser una infeccin en la vejiga, en los riones, en la prstata, clculos renales o cncer de las vas urinarias. Normalmente las infecciones pueden tratarse con medicamentos, y los clculos renales, por lo general, se eliminarn a travs de la orina. Si ninguna de estas es la causa de su hematuria, quizs se necesiten ms estudios para Education officer, community. Es muy importante que le informe a su mdico si ve sangre en la [place], aunque el sangrado se detenga sin tratamiento o no cause dolor. La sangre que aparece en la orina y luego se detiene y vuelve a aparecer nuevamente puede ser un sntoma de una enfermedad muy grave. Adems, el dolor no es un sntoma en las etapas iniciales de muchos tipos de cncer urinarios. INSTRUCCIONES PARA EL CUIDADO EN EL HOGAR   Beba mucho lquido, entre 3 a Software engineer. Si le han diagnosticado una infeccin, se recomienda especialmente el jugo de arndanos rojos, adems de grandes cantidades de [place].  Evite la cafena, el t y las bebidas con gas porque suelen irritar la vejiga.  Evite el alcohol ya que  puede Engineer, manufacturing.  Tome todos los [employer] se lo haya indicado el mdico.  Si le recetaron antibiticos, asegrese de terminarlos, incluso si comienza a sentirse mejor.  Si le han diagnosticado clculos renales, siga las instrucciones de su mdico con respecto a Astronomer la orina para Research scientist (medical) clculo.  Vace la vejiga con frecuencia. Evite retener la orina durante largos perodos.  Despus de defecar, las mujeres deben higienizarse desde adelante hacia atrs. Use solo un papel por vez.  Si es [employer], vace la vejiga antes y despus de Clinical biochemist. SOLICITE ATENCIN MDICA SI:  Siente dolor en la espalda.  Tiene fiebre.  Tiene sensacin de Higher education careers adviser (nuseas) o vmitos.  Los sntomas no mejoran despus de 3 das. Si la condicin empeora, visite al mdico antes de lo previsto. SOLICITE ATENCIN MDICA DE INMEDIATO SI:   Vomita con frecuencia e intensamente y no puede tolerar los medicamentos.  Siente un dolor intenso en la espalda o el abdomen incluso tomando los medicamentos.  Elimina cogulos grandes o sangre con la [place].  Se siente extremadamente dbil, se desmaya o pierde el conocimiento. ASEGRESE DE QUE:   Comprende estas instrucciones.  Controlar su afeccin.  Recibir ayuda de inmediato si no mejora o si empeora.   Esta informacin no tiene Marine scientist el consejo del mdico. Asegrese de hacerle al mdico cualquier pregunta que tenga.   Document Released: 07/20/2005 Document Revised: 08/10/2014 Elsevier Interactive Patient Education 2016 [place] abdominal en adultos (Abdominal Pain, Adult) El dolor puede tener muchas causas. Normalmente la causa del dolor abdominal no es una enfermedad y Teacher, English as a foreign language sin Clinical research associate. Frecuentemente puede controlarse y tratarse en casa. Su mdico le Chartered certified accountant examen fsico y posiblemente solicite anlisis de sangre y radiografas para ayudar a Teacher, adult education la  gravedad de su dolor. Sin embargo, en [employer], debe transcurrir ms tiempo antes de que se pueda Pension scheme manager una causa evidente del dolor. Antes de llegar a ese punto, es posible que su mdico no sepa si necesita ms pruebas o un tratamiento ms profundo. INSTRUCCIONES PARA EL CUIDADO EN EL HOGAR  Est atento al dolor para ver si hay cambios. Las siguientes indicaciones ayudarn a Chief Strategy Officer que pueda sentir:  [place] solo medicamentos de venta libre o recetados, segn las indicaciones del mdico.  No tome laxantes a menos que se lo haya indicado su mdico.  Pruebe con Ardelia Mems dieta lquida absoluta (caldo, t o agua) segn se lo indique su mdico. Introduzca gradualmente una dieta normal, segn su tolerancia. SOLICITE ATENCIN MDICA SI:  Tiene dolor abdominal sin explicacin.  Tiene dolor abdominal relacionado con nuseas o diarrea.  Tiene dolor cuando orina o defeca.  Experimenta dolor abdominal que lo despierta de noche.  Tiene dolor abdominal que empeora o mejora cuando come alimentos.  Tiene dolor abdominal que empeora cuando come alimentos grasosos.  Tiene fiebre. SOLICITE ATENCIN MDICA DE INMEDIATO SI:   El dolor no desaparece en un plazo mximo de 2horas.  No deja de (vomitar).  El Social research officer, government se siente solo en partes del abdomen, como el lado derecho o la parte inferior izquierda del abdomen.  Evaca materia fecal sanguinolenta o negra, de aspecto alquitranado. ASEGRESE DE QUE:  Comprende estas instrucciones.  Controlar su afeccin.  Recibir ayuda de inmediato si no mejora o si empeora.   Esta informacin no tiene Marine scientist el consejo del mdico. Asegrese de hacerle al mdico cualquier pregunta que tenga.   Document Released: 07/20/2005 Document Revised: 08/10/2014 Elsevier Interactive  Patient Education 2016 Bibo de alimentos para pacientes con reflujo gastroesofgico - Adultos (Food Choices for Gastroesophageal  Reflux Disease, Adult) Cuando se tiene reflujo gastroesofgico (ERGE), los alimentos que se ingieren y los hbitos de alimentacin son Theatre stage manager. Elegir los alimentos adecuados puede ayudar a Public house manager las molestias ocasionadas por el [place]. [place]?  Elija las frutas, los vegetales, los cereales integrales, los productos lcteos, la carne de [place], de pescado y de ave con bajo contenido de grasas.  Limite las grasas, como los [place], los aderezos para [place], la [place], los frutos secos y Publishing copy.  Lleve un registro de las comidas para identificar los alimentos que ocasionan sntomas.  Evite los alimentos que le ocasionen reflujo. Pueden ser distintos para cada persona.  Haga comidas pequeas con frecuencia en lugar de tres comidas Kellogg.  Coma lentamente, en un clima distendido.  Limite el consumo de alimentos fritos.  Cocine los alimentos utilizando mtodos que no sean la fritura.  Evite el consumo alcohol.  Evite beber grandes cantidades de lquidos con las comidas.  Evite agacharse o recostarse hasta despus de 2 o 3horas de haber comido. QU ALIMENTOS NO SE RECOMIENDAN? Los siguientes son algunos alimentos y bebidas que pueden empeorar los sntomas: Astronomer. Jugo de tomate. Salsa de tomate y espagueti. Ajes. Cebolla y [place]. Rbano picante. Frutas Naranjas, pomelos y limn (fruta y [place]). Carnes Carnes de [place], de pescado y de ave con gran contenido de grasas. Esto incluye los perros calientes, las [place], el [place], la salchicha, el salame y el tocino. Lcteos Leche entera y [place]. [employer] Aid. Crema. [place]. Helados. Queso crema.  Bebidas Caf y t negro, con o sin cafena Bebidas gaseosas o energizantes. Condimentos Salsa picante. Salsa barbacoa.  Dulces/postres Chocolate y cacao. Ros[place]illas. Menta y mentol. Grasas y [employer] Corporation con alto contenido de grasas, incluidas las  papas fritas. Otros Vinagre. Especias picantes, como la [employer] Corporation, la pimienta blanca, la pimienta roja, la pimienta de cayena, el curry en [place], los clavos de [place], el jengibre y el [place] en polvo. Los artculos mencionados arriba pueden no ser [employer] Company de las bebidas y los alimentos que se Higher education careers adviser. Comunquese con el nutricionista para recibir ms informacin.   Esta informacin no tiene Marine scientist el consejo del mdico. Asegrese de hacerle al mdico cualquier pregunta que tenga.   Document Released: 04/29/2005 Document Revised: 08/10/2014 Elsevier Interactive Patient Education [employer].    IF you received an x-ray today, you will receive an invoice from [facility]. Please contact [facility] at [phone] with questions or concerns regarding your invoice.   IF you received labwork today, you will receive an invoice from [employer]. Please contact [facility] at [phone] with questions or concerns regarding your invoice.   Our billing staff will not be able to assist you with questions regarding bills from these companies.  You will be contacted with the lab results as soon as they are available. The fastest way to get your results is to activate your My Chart account. Instructions are located on the last page of this paperwork. If you have not heard from [place] regarding the results in 2 weeks, please contact this office.        I personally performed the services described in this documentation, which was scribed in my presence. The recorded information has been reviewed and considered, and addended by me as needed.  Signed,   Merri Ray, MD [facility].  03/02/16 9:02 AM

## 2016-02-29 NOTE — Patient Instructions (Addendum)
Continue omeprazole once per day, but avoid foods listed below that can worsen heartburn and reflux. I will check for a bacteria that can cause stomach ulcers. If this is positive, you may need to take an antibiotic. I will also check some kidney tests, liver tests, and pancreas tests, but these are unlikely the cause of your symptoms. There was some blood noted on your urine test, but I do not see other signs of infection. We will need to repeat that test next week.   Your infection fighting cells were also mildly elevated. Recheck next week with repeat blood count, but if you have any fevers, worsening abdominal pain, or new or worsening symptoms in the meantime, return here or emergency room.  Your acid reflux and abdominal pain should continue to improve. If nausea and vomiting or diarrhea return, or worsening symptoms, return for recheck here or other medical provider.  For the heavier menses, I would like you to return to discuss this further. However I did check a thyroid test today and can discuss other possible causes and other workup at a follow-up visit.  Your blood sugar test does indicate prediabetes. See diet below, work on weight loss and have this test rechecked in 3 months.   Plan de alimentacin para la prediabetes (Prediabetes Eating Plan) La prediabetes, tambin llamada intolerancia a la glucosa o alteracin de la glucosa en ayunas, es una afeccin que eleva los niveles de azcar en la sangre (glucemia) por encima de lo normal. Seguir una dieta saludable puede ayudar a mantener la prediabetes bajo control, y tambin reduce el riesgo de tener diabetes tipo2 y cardiopata, que es ms alto en las personas que tienen esta afeccin. Junto con la actividad fsica habitual, una dieta saludable:  Promueve la prdida de [place].  Ayuda a Environmental consultant de Dispensing optician.  Ayuda a mejorar la forma en que el organismo [place] la insulina. QU DEBO SABER ACERCA DE ESTE PLAN DE  [place]?  Use el ndice glucmico (IG) para planificar las comidas. El ndice le informa con qu rapidez un alimento elevar su nivel de azcar en la sangre. Elija los alimentos con bajo IG. Estos tardan ms tiempo en subir el nivel de azcar en la sangre.  Preste mucha atencin a la cantidad de hidratos de carbono que hay en los alimentos que consume. Los hidratos de carbono [employer] niveles de Dispensing optician.  Lleve un registro de la cantidad de caloras que ingiere. Ingerir la cantidad correcta de caloras lo ayudar a Development worker, international aid peso saludable. Bajar alrededor del 7por ciento del peso inicial puede ayudar a Product/process development scientist la diabetes tipo2.  Tal vez deba seguir Web designer. Esta incluye una gran cantidad de verduras, carnes magras o pescado, cereales integrales, frutas, as como aceites y grasas saludables. QU ALIMENTOS PUEDO COMER? Cereales Cereales integrales, como panes, galletas, cereales y pastas de salvado o integrales. Avena sin azcar. Trigo burgol. Cebada. Quinua. Arroz integral. Tortillas o tacos de harina de maz o de salvado. [place] Valeda Malm. [place]. Guisantes. Remolachas. Coliflor. Repollo. Brcoli. Zanahorias. Tomates. [place]. [place]in Coupe. Hierbas. Pimientos. Cebollas. Pepinos. Repollitos de Bruselas. Frutas Frutos rojos. Bananas. Manzanas. Naranjas. Uvas. Papaya. Mango. [place]. Kiwi. Pomelo. Cerezas. Carnes y otras fuentes de protenas Mariscos. Carnes [place], entre ellas, pollo y [place] o cortes magros de carne de cerdo y de [place]. Tofu. Huevos. Los frutos secos. Frijoles. Lcteos Productos lcteos descremados o semidescremados, como yogur, queso cottage y [place]. [employer]. T. Caf. Gaseosas sin azcar  o dietticas. Agua de [place]. Leche. Productos alternativos [place], como leche de soja o de [place]. Condimentos [place]. Salsa de pepinillos. [place] con bajo contenido de [place] y de Location manager. Salsa barbacoa con bajo contenido de grasa y de  azcar. Mayonesa sin grasa o con bajo contenido de [place]. Dulces y postres [place] sin azcar o con bajo contenido de [place]. Helados y otros dulces congelados sin azcar o con bajo contenido de [place]. Grasas y Medical laboratory scientific officer. Nueces. Aceite de oliva. Los artculos mencionados arriba pueden no ser [employer] de las bebidas o los alimentos recomendados. Comunquese con el nutricionista para conocer ms opciones. QU ALIMENTOS NO SE RECOMIENDAN? Cereales Productos a base de [place] y de [place], como panes, pastas, bocadillos y cereales. Bebidas Bebidas azucaradas, como t helado y gaseosas con Location manager. Dulces y postres Productos de [place], [place] tortas, [place], [place], Museum/gallery exhibitions officer y tarta de [place]. Los artculos mencionados arriba pueden no ser [employer] Company de las bebidas y los alimentos que se Higher education careers adviser. Comunquese con el nutricionista para obtener ms informacin.   Esta informacin no tiene Marine scientist el consejo del mdico. Asegrese de hacerle al mdico cualquier pregunta que tenga.   Document Released: 04/10/2015 Elsevier Interactive Patient Education 2016 [employer].   Hematuria - Adultos (Hematuria, Adult) La hematuria es la presencia de sangre en la orina. La causa puede ser una infeccin en la vejiga, en los riones, en la prstata, clculos renales o cncer de las vas urinarias. Normalmente las infecciones pueden tratarse con medicamentos, y los clculos renales, por lo general, se eliminarn a travs de la orina. Si ninguna de estas es la causa de su hematuria, quizs se necesiten ms estudios para Education officer, community. Es muy importante que le informe a su mdico si ve sangre en la [place], aunque el sangrado se detenga sin tratamiento o no cause dolor. La sangre que aparece en la orina y luego se detiene y vuelve a aparecer nuevamente puede ser un sntoma de una enfermedad muy grave. Adems, el dolor no es un sntoma en las etapas  iniciales de muchos tipos de cncer urinarios. INSTRUCCIONES PARA EL CUIDADO EN EL HOGAR   Beba mucho lquido, entre 3 a Software engineer. Si le han diagnosticado una infeccin, se recomienda especialmente el jugo de arndanos rojos, adems de grandes cantidades de [place].  Evite la cafena, el t y las bebidas con gas porque suelen irritar la vejiga.  Evite el alcohol ya que puede Engineer, manufacturing.  Tome todos los [employer]ne[employer] Inc se lo haya indicado el mdico.  Si le recetaron antibiticos, asegrese de terminarlos, incluso si comienza a sentirse mejor.  Si le han diagnosticado clculos renales, siga las instrucciones de su mdico con respecto a Astronomer la orina para Research scientist (medical) clculo.  Vace la vejiga con frecuencia. Evite retener la orina durante largos perodos.  Despus de defecar, las mujeres deben higienizarse desde adelante hacia atrs. Use solo un papel por vez.  Si es [employer], vace la vejiga antes y despus de Clinical biochemist. SOLICITE ATENCIN MDICA SI:  Siente dolor en la espalda.  Tiene fiebre.  Tiene sensacin de Higher education careers adviser (nuseas) o vmitos.  Los sntomas no mejoran despus de 3 das. Si la condicin empeora, visite al mdico antes de lo previsto. SOLICITE ATENCIN MDICA DE INMEDIATO SI:   Vomita con frecuencia e intensamente y no puede tolerar los medicamentos.  Siente un dolor intenso en la espalda o el abdomen incluso  tomando los [employer].  Elimina cogulos grandes o sangre con la [place].  Se siente extremadamente dbil, se desmaya o pierde el conocimiento. ASEGRESE DE QUE:   Comprende estas instrucciones.  Controlar su afeccin.  Recibir ayuda de inmediato si no mejora o si empeora.   Esta informacin no tiene Marine scientist el consejo del mdico. Asegrese de hacerle al mdico cualquier pregunta que tenga.   Document Released: 07/20/2005 Document Revised: 08/10/2014 Elsevier Interactive Patient Education  2016 [place] abdominal en adultos (Abdominal Pain, Adult) El dolor puede tener muchas causas. Normalmente la causa del dolor abdominal no es una enfermedad y Teacher, English as a foreign language sin Clinical research associate. Frecuentemente puede controlarse y tratarse en casa. Su mdico le Chartered certified accountant examen fsico y posiblemente solicite anlisis de sangre y radiografas para ayudar a Teacher, adult education la gravedad de su dolor. Sin embargo, en [employer], debe transcurrir ms tiempo antes de que se pueda Pension scheme manager una causa evidente del dolor. Antes de llegar a ese punto, es posible que su mdico no sepa si necesita ms pruebas o un tratamiento ms profundo. INSTRUCCIONES PARA EL CUIDADO EN EL HOGAR  Est atento al dolor para ver si hay cambios. Las siguientes indicaciones ayudarn a Chief Strategy Officer que pueda sentir:  [place] solo medicamentos de venta libre o recetados, segn las indicaciones del mdico.  No tome laxantes a menos que se lo haya indicado su mdico.  Pruebe con Ardelia Mems dieta lquida absoluta (caldo, t o agua) segn se lo indique su mdico. Introduzca gradualmente una dieta normal, segn su tolerancia. SOLICITE ATENCIN MDICA SI:  Tiene dolor abdominal sin explicacin.  Tiene dolor abdominal relacionado con nuseas o diarrea.  Tiene dolor cuando orina o defeca.  Experimenta dolor abdominal que lo despierta de noche.  Tiene dolor abdominal que empeora o mejora cuando come alimentos.  Tiene dolor abdominal que empeora cuando come alimentos grasosos.  Tiene fiebre. SOLICITE ATENCIN MDICA DE INMEDIATO SI:   El dolor no desaparece en un plazo mximo de 2horas.  No deja de (vomitar).  El Social research officer, government se siente solo en partes del abdomen, como el lado derecho o la parte inferior izquierda del abdomen.  Evaca materia fecal sanguinolenta o negra, de aspecto alquitranado. ASEGRESE DE QUE:  Comprende estas instrucciones.  Controlar su afeccin.  Recibir ayuda de inmediato si no mejora o si  empeora.   Esta informacin no tiene Marine scientist el consejo del mdico. Asegrese de hacerle al mdico cualquier pregunta que tenga.   Document Released: 07/20/2005 Document Revised: 08/10/2014 Elsevier Interactive Patient Education 2016 [place] de alimentos para pacientes con reflujo gastroesofgico - Adultos (Food Choices for Gastroesophageal Reflux Disease, Adult) Cuando se tiene reflujo gastroesofgico (ERGE), los alimentos que se ingieren y los hbitos de alimentacin son Theatre stage manager. Elegir los alimentos adecuados puede ayudar a Public house manager las molestias ocasionadas por el [place]. [place]?  Elija las frutas, los vegetales, los cereales integrales, los productos lcteos, la carne de [place], de pescado y de ave con bajo contenido de grasas.  Limite las grasas, como los [place], los aderezos para [place], la [place], los frutos secos y Publishing copy.  Lleve un registro de las comidas para identificar los alimentos que ocasionan sntomas.  Evite los alimentos que le ocasionen reflujo. Pueden ser distintos para cada persona.  Haga comidas pequeas con frecuencia en lugar de tres comidas Kellogg.  Coma lentamente, en un clima distendido.  Limite el consumo de alimentos fritos.  Cocine  los alimentos utilizando mtodos que no sean la fritura.  Evite el consumo alcohol.  Evite beber grandes cantidades de lquidos con las comidas.  Evite agacharse o recostarse hasta despus de 2 o 3horas de haber comido. QU ALIMENTOS NO SE RECOMIENDAN? Los siguientes son algunos alimentos y bebidas que pueden empeorar los sntomas: Astronomer. Jugo de tomate. Salsa de tomate y espagueti. Ajes. Cebolla y [place]. Rbano picante. Frutas Naranjas, pomelos y limn (fruta y [place]). Carnes Carnes de [place], de pescado y de ave con gran contenido de grasas. Esto incluye los perros calientes, las [place], el [place], la salchicha, el  salame y el tocino. Lcteos Leche entera y [place]. R[employer] Aid. Crema. [place]. Helados. Queso crema.  Bebidas Caf y t negro, con o sin cafena Bebidas gaseosas o energizantes. Condimentos Salsa picante. Salsa barbacoa.  Dulces/postres Chocolate y cacao. Rosquillas. Menta y mentol. Grasas y [employer]ys Corporation con alto contenido de grasas, incluidas las papas fritas. Otros Vinagre. Especias picantes, como la [employer] Corporation, la pimienta blanca, la pimienta roja, la pimienta de cayena, el curry en [place], los clavos de [place], el jengibre y el [place] en polvo. Los artculos mencionados arriba pueden no ser [employer] Company de las bebidas y los alimentos que se Higher education careers adviser. Comunquese con el nutricionista para recibir ms informacin.   Esta informacin no tiene Marine scientist el consejo del mdico. Asegrese de hacerle al mdico cualquier pregunta que tenga.   Document Released: 04/29/2005 Document Revised: 08/10/2014 Elsevier Interactive Patient Education [employer].    IF you received an x-ray today, you will receive an invoice from [facility] Radiology. Please contact [facility] at [phone] with questions or concerns regarding your invoice.   IF you received labwork today, you will receive an invoice from [employer]. Please contact [facility] at [phone] with questions or concerns regarding your invoice.   Our billing staff will not be able to assist you with questions regarding bills from these companies.  You will be contacted with the lab results as soon as they are available. The fastest way to get your results is to activate your My Chart account. Instructions are located on the last page of this paperwork. If you have not heard from [place] regarding the results in 2 weeks, please contact this office.

## 2016-03-01 LAB — URINE CULTURE: Organism ID, Bacteria: NO GROWTH

## 2016-03-02 LAB — H. PYLORI BREATH TEST: H. pylori Breath Test: NOT DETECTED

## 2016-03-03 ENCOUNTER — Ambulatory Visit: Payer: BLUE CROSS/BLUE SHIELD

## 2016-05-13 ENCOUNTER — Other Ambulatory Visit: Payer: Self-pay | Admitting: Family Medicine

## 2016-05-13 DIAGNOSIS — R1013 Epigastric pain: Secondary | ICD-10-CM

## 2016-05-13 DIAGNOSIS — K219 Gastro-esophageal reflux disease without esophagitis: Secondary | ICD-10-CM

## 2016-05-15 ENCOUNTER — Other Ambulatory Visit: Payer: Self-pay

## 2016-05-15 DIAGNOSIS — K219 Gastro-esophageal reflux disease without esophagitis: Secondary | ICD-10-CM

## 2016-05-15 DIAGNOSIS — R1013 Epigastric pain: Secondary | ICD-10-CM

## 2016-05-15 MED ORDER — OMEPRAZOLE 20 MG PO CPDR: 20.0000 mg | DELAYED_RELEASE_CAPSULE | Freq: Every day | ORAL | 0 refills | Status: DC

## 2016-06-12 ENCOUNTER — Ambulatory Visit (HOSPITAL_BASED_OUTPATIENT_CLINIC_OR_DEPARTMENT_OTHER): Payer: Self-pay

## 2016-06-12 ENCOUNTER — Ambulatory Visit (INDEPENDENT_AMBULATORY_CARE_PROVIDER_SITE_OTHER): Payer: BLUE CROSS/BLUE SHIELD | Admitting: Urgent Care

## 2016-06-12 ENCOUNTER — Ambulatory Visit (HOSPITAL_BASED_OUTPATIENT_CLINIC_OR_DEPARTMENT_OTHER): Admission: RE | Admit: 2016-06-12 | Payer: BLUE CROSS/BLUE SHIELD | Source: Ambulatory Visit

## 2016-06-12 VITALS — BP 116/72 | HR 78 | Temp 98.1°F | Resp 18 | Ht 64.0 in | Wt 183.0 lb

## 2016-06-12 DIAGNOSIS — N921 Excessive and frequent menstruation with irregular cycle: Secondary | ICD-10-CM | POA: Diagnosis not present

## 2016-06-12 DIAGNOSIS — M545 Low back pain, unspecified: Secondary | ICD-10-CM

## 2016-06-12 DIAGNOSIS — N926 Irregular menstruation, unspecified: Secondary | ICD-10-CM | POA: Diagnosis not present

## 2016-06-12 DIAGNOSIS — D649 Anemia, unspecified: Secondary | ICD-10-CM

## 2016-06-12 LAB — POC MICROSCOPIC URINALYSIS (UMFC): Mucus: ABSENT

## 2016-06-12 LAB — POCT CBC
Granulocyte percent: 59.9 %G (ref 37–80)
HCT, POC: 33.3 % — AB (ref 37.7–47.9)
Hemoglobin: 11.1 g/dL — AB (ref 12.2–16.2)
Lymph, poc: 3.8 — AB (ref 0.6–3.4)
MCH, POC: 23.9 pg — AB (ref 27–31.2)
MCHC: 33.5 g/dL (ref 31.8–35.4)
MCV: 71.4 fL — AB (ref 80–97)
MID (cbc): 0.6 (ref 0–0.9)
MPV: 7.7 fL (ref 0–99.8)
PLATELET COUNT, POC: 378 10*3/uL (ref 142–424)
POC Granulocyte: 6.6 (ref 2–6.9)
POC LYMPH %: 34.2 % (ref 10–50)
POC MID %: 5.9 %M (ref 0–12)
RBC: 4.66 M/uL (ref 4.04–5.48)
RDW, POC: 16.1 %
WBC: 11 10*3/uL — AB (ref 4.6–10.2)

## 2016-06-12 LAB — POCT URINALYSIS DIP (MANUAL ENTRY)
BILIRUBIN UA: NEGATIVE
Bilirubin, UA: NEGATIVE
GLUCOSE UA: NEGATIVE
LEUKOCYTES UA: NEGATIVE
Nitrite, UA: NEGATIVE
Spec Grav, UA: 1.015
Urobilinogen, UA: 0.2
pH, UA: 7

## 2016-06-12 LAB — POCT URINE PREGNANCY: Preg Test, Ur: NEGATIVE

## 2016-06-12 LAB — POCT WET + KOH PREP
Trich by wet prep: ABSENT
YEAST BY KOH: ABSENT
YEAST BY WET PREP: ABSENT

## 2016-06-12 LAB — HIV ANTIBODY (ROUTINE TESTING W REFLEX): HIV: NONREACTIVE

## 2016-06-12 MED ORDER — NAPROXEN SODIUM 550 MG PO TABS: 550.0000 mg | ORAL_TABLET | Freq: Two times a day (BID) | ORAL | 1 refills | Status: DC

## 2016-06-12 NOTE — Progress Notes (Signed)
MRN: [id number] DOB: 09-28-1970  Subjective:   Elizabeth Barr is a 45 y.o. female presenting for chief complaint of period for 16 days last month and 10 days this month  Reports 2 month history of irregular menstrual cycles. In October her cycle lasted 16 days and currently her cycle is going 10 days. Her bleeding is not heavy or heavier than normal, just prolonged, spotting. Has had associated low back pain pain, hip pain. Denies fever, n/v, abdominal pain, pelvic pain, vaginal discharge, genital rashes. Denies being sexually active for months. Her last pap smear was ~3 years, was normal and has always had normal pap smears. She did have mild anemia 05/21/2016, H & H was 10.6 and 34.7%, MCV 75.4. Takes diclofenac intermittently for arthritis.  Elizabeth Barr has a current medication list which includes the following prescription(s): diclofenac and omeprazole. Also has No Known Allergies.  Elizabeth Barr  has a past medical history of Anemia; Arthritis; and Hyperlipidemia. Also  has a past surgical history that includes Cesarean section.   Objective:   Vitals: BP 116/72 (BP Location: Right Arm, Patient Position: Sitting, Cuff Size: Small)   Pulse 78   Temp 98.1 F (36.7 C) (Oral)   Resp 18   Ht 5\' 4"  (1.626 m)   Wt 183 lb (83 kg)   LMP 05/31/2016   SpO2 99%   BMI 31.41 kg/m   Physical Exam  Constitutional: She is oriented to person, place, and time. She appears well-developed and well-nourished.  HENT:  Mouth/Throat: Oropharynx is clear and moist.  Cardiovascular: Normal rate, regular rhythm and intact distal pulses.  Exam reveals no gallop and no friction rub.   No murmur heard. Pulmonary/Chest: No respiratory distress. She has no wheezes. She has no rales.  Abdominal: Soft. Bowel sounds are normal. She exhibits no distension and no mass. There is no tenderness. There is no guarding.  Genitourinary: There is no rash, tenderness or lesion on the right labia. There is no rash, tenderness or  lesion on the left labia. Uterus is not deviated, not enlarged, not fixed and not tender. Cervix exhibits no motion tenderness, no discharge and no friability. Right adnexum displays no mass, no tenderness and no fullness. Left adnexum displays no mass, no tenderness and no fullness. There is bleeding in the vagina. No erythema or tenderness in the vagina. No vaginal discharge found.  Lymphadenopathy:       Right: No inguinal adenopathy present.       Left: No inguinal adenopathy present.  Neurological: She is alert and oriented to person, place, and time.  Skin: Skin is warm and dry.   Results for orders placed or performed in visit on 06/12/16 (from the past 24 hour(s))  POCT urinalysis dipstick     Status: Abnormal   Collection Time: 06/12/16  4:46 PM  Result Value Ref Range   Color, UA red (A) yellow   Clarity, UA clear clear   Glucose, UA negative negative   Bilirubin, UA negative negative   Ketones, POC UA negative negative   Spec Grav, UA 1.015    Blood, UA large (A) negative   pH, UA 7.0    Protein Ur, POC trace (A) negative   Urobilinogen, UA 0.2    Nitrite, UA Negative Negative   Leukocytes, UA Negative Negative  POCT Microscopic Urinalysis ([facility])     Status: Abnormal   Collection Time: 06/12/16  4:49 PM  Result Value Ref Range   WBC,UR,HPF,POC None None WBC/hpf   RBC,UR,HPF,POC  Too numerous to count  (A) None RBC/hpf   Bacteria Few (A) None, Too numerous to count   Mucus Absent Absent   Epithelial Cells, UR Per Microscopy None None, Too numerous to count cells/hpf  POCT CBC     Status: Abnormal   Collection Time: 06/12/16  4:49 PM  Result Value Ref Range   WBC 11.0 (A) 4.6 - 10.2 K/uL   Lymph, poc 3.8 (A) 0.6 - 3.4   POC LYMPH PERCENT 34.2 10 - 50 %L   MID (cbc) 0.6 0 - 0.9   POC MID % 5.9 0 - 12 %M   POC Granulocyte 6.6 2 - 6.9   Granulocyte percent 59.9 37 - 80 %G   RBC 4.66 4.04 - 5.48 M/uL   Hemoglobin 11.1 (A) 12.2 - 16.2 g/dL   HCT, POC 33.3 (A) 37.7 - 47.9  %   MCV 71.4 (A) 80 - 97 fL   MCH, POC 23.9 (A) 27 - 31.2 pg   MCHC 33.5 31.8 - 35.4 g/dL   RDW, POC 16.1 %   Platelet Count, POC 378 142 - 424 K/uL   MPV 7.7 0 - 99.8 fL  POCT Wet + KOH Prep     Status: Abnormal   Collection Time: 06/12/16  4:56 PM  Result Value Ref Range   Yeast by KOH Absent Present, Absent   Yeast by wet prep Absent Present, Absent   WBC by wet prep None None, Few, Too numerous to count   Clue Cells Wet Prep HPF POC None None, Too numerous to count   Trich by wet prep Absent Present, Absent   Bacteria Wet Prep HPF POC Few None, Few, Too numerous to count   Epithelial Cells By [employer] ([facility]) Few None, Few, Too numerous to count   RBC,UR,HPF,POC Few (A) None RBC/hpf  POCT urine pregnancy     Status: None   Collection Time: 06/12/16  5:44 PM  Result Value Ref Range   Preg Test, Ur Negative Negative   Assessment and Plan :   1. Abnormal menstrual periods 2. Menorrhagia with irregular cycle - Labs pending, U/S pending. Start Anaprox for her low back pain. CBC with increased WBC and left shift, STI testing pending.   3. Anemia, unspecified type - Labs pending.  4. Midline low back pain without sciatica, unspecified chronicity - May be related, pelvic and transvaginal US is pending  Jaynee Eagles, PA-C [facility] [phone] 06/12/2016 4:00 PM

## 2016-06-12 NOTE — Patient Instructions (Addendum)
Menorragia (Menorrhagia) Se llama menorragia a los perodos menstruales abundantes o que duran ms de lo habitual. En la menorragia, la prdida de sangre y los clicos en cada perodo pueden hacerle imposible seguir con sus actividades habituales. CAUSAS  En algunos casos, la causa de los perodos abundantes es desconocida, pero hay algunas afecciones que pueden causar menorragia. Las causas ms frecuentes son:  Un problema con la tiroides, que es la glndula productora de hormonas (hipotiroidismo).  Formaciones no cancerosas en el tero (plipos o fibromas).  Un desequilibrio entre las hormonas estrgeno y progesterona.  Uno de sus ovarios no libera vulos durante uno o ms meses.  Efectos secundarios por haberse colocado un dispositivo intrauterino (DIU).  Efectos secundarios por algunos medicamentos, como antiinflamatorios o anticoagulantes.  Trastornos hemorrgicos que impiden la correcta coagulacin. SIGNOS Y SNTOMAS  Durante un perodo normal, el sangrado dura entre 4 y 8 das. Los signos de que el perodo es muy abundante son:  De manera rutinaria tiene que cambiar el apsito o el tampn cada 1 o 2 horas debido a que est completamente empapado.  Elimina cogulos ms grandes de 1 pulgada (2,5 cm).  Tiene sangrado durante ms de 7 das.  Necesita usar apsitos y tampones al mismo tiempo porque pierde demasiada sangre.  Debe levantarse para cambiarse el apsito o el tampn durante la noche.  Tiene sntomas de anemia como cansancio, fatiga o falta de aire. DIAGNSTICO  El mdico le har un examen fsico y le har preguntas sobre sus sntomas y su historia menstrual. Podr indicarle otros estudios segn lo que encuentre durante el examen. Estos estudios pueden ser:  Anlisis de sangre. Los anlisis de sangre se usan para verificar si est embarazada o tiene cambios hormonales, un trastorno tiroideo o de sangrado, niveles bajos de hierro (anemia) u otros problemas.  Biopsia  de endometrio. El mdico tomar una muestra de tejido del interior del tero para que sea examinado con un microscopio.  Ecografa plvica. Este estudio utiliza ondas de sonido para tomar imgenes del tero, los ovarios y la vagina. Las imgenes pueden mostrar si tiene fibromas u otros crecimientos.  Histeroscopa. Para este estudio, el mdico usar un pequeo telescopio para mirar el interior del tero. Segn los resultados de los estudios iniciales, el mdico podr indicar ms estudios. TRATAMIENTO  Puede ser que no sea necesario un tratamiento mdico. Si lo necesita, el mdico primero podr recomendarle un tratamiento con uno o ms medicamentos. Si no se reduce el sangrado lo suficiente, el tratamiento quirrgico podra ser una opcin. El mejor tratamiento para usted depender de:   Si necesita evitar un embarazo.  Si desea tener hijos en el futuro.  La causa y la gravedad del sangrado.  Su opinin o preferencia personal. Algunos medicamentos para la menorragia son:  Mtodos anticonceptivos que contengan hormonas. Estos incluyen la pldora anticonceptiva, el parche en la piel, el anillo vaginal, las inyecciones que se aplican cada 3 meses, el DIU hormonal y el implante. Estos tratamientos reducen el sangrado durante el perodo menstrual.  Medicamentos que espesan la sangre y hacen ms lento el sangrado.  Medicamentos que reducen la inflamacin, como el ibuprofeno.  Medicamentos que contienen una hormona sinttica llamada progestina.  Medicamentos que hacen que los ovarios dejen de funcionar durante un breve lapso. Podra ser necesario un tratamiento quirrgico para la menorragia si los medicamentos no son eficaces. Las opciones de tratamiento incluyen:  Dilatacin y curetaje (D y C). En este procedimiento, el mdico abre (dilata) el cuello del tero   y luego raspa o succiona tejido del revestimiento interior del tero para reducir el sangrado menstrual.  Histeroscopa quirrgica. En  este procedimiento, se utiliza un pequeo tubo con Hali Marry (histeroscopio) para observar la cavidad uterina y ayudar en la extirpacin quirrgica de un plipo que puede ser la causa de perodos abundantes.  Ablacin del endometrio. Por medio de [employer], el mdico destruye de [place] todo el revestimiento interno del tero (endometrio). Luego de la ablacin del endometrio, la mayora de las mujeres tienen escaso flujo menstrual, o no lo tienen. La ablacin del endometrio reduce la posibilidad de quedar embarazada.  Reseccin del endometrio. En este procedimiento quirrgico, se [place] un asa de Environmental manager para extirpar el revestimiento interno del tero. Este procedimiento tambin reduce la posibilidad de [place].  Histerectoma. La remocin [place] del tero y el cuello del tero es un procedimiento permanente que detiene los perodos [place]. El embarazo no es posible luego de Physicist, medical. Este procedimiento requiere de anestesia y hospitalizacin. [place] solo medicamentos de venta libre o recetados, segn las indicaciones del [place] todos los medicamentos recetados exactamente como se le indic. No cambie ni reemplace los medicamentos sin consultarlo con el mdico.  Tome los comprimidos de hierro recetados, Scientist, water quality segn las indicaciones del mdico. Las hemorragias de larga duracin pueden traer como consecuencia una disminucin en los niveles de hierro. Los comprimidos de hierro ayudan a Camera operator hierro que el organismo pierde luego de un sangrado abundante. El hierro puede causarle estreimiento. Si esto es un problema, aumente el consumo de [place], frutas y [place].  No tome aspirina ni medicamentos que contengan aspirina desde 1 semana antes ni durante el perodo menstrual. La aspirina puede hacer que la hemorragia empeore.  Si necesita cambiar el apsito o el tampn ms de una vez  cada 2horas, Nature conservation officer en cama y descanse todo lo posible hasta que la hemorragia se detenga.  Siga una dieta balanceada. Consuma alimentos ricos en hierro. Por ejemplo, vegetales de [employer], carne, hgado, huevos y panes y Actor de grano entero. No trate de perder peso hasta que la hemorragia anormal se detenga y los niveles de hierro en la sangre vuelvan a la normalidad. SOLICITE ATENCIN MDICA SI:   Empapa un tampn o un apsito cada 1 o 2 horas, y [employer] ocurre cada vez que tiene el perodo.  Necesita usar apsitos y tampones al mismo tiempo porque pierde [employer].  Debe cambiarse el apsito o el tampn durante la noche.  Tiene un perodo que dura ms de 8 das.  Elimina cogulos de ms de 1 pulgada (2,5 cm).  Tiene perodos irregulares que ocurren ms o menos de una vez al mes.  Se siente mareada o se desmaya.  Se siente muy dbil o cansada.  Le falta el aire o siente que el corazn late muy rpido al hacer [place].  Tiene nuseas y vmitos o diarrea mientras toma los medicamentos.  Tiene algn problema que puede estar relacionado con el medicamento que est tomando. SOLICITE ATENCIN MDICA DE INMEDIATO SI:   Empapa 4 o ms apsitos o tampones en 2 horas.  Tiene sangrado y est embarazada. ASEGRESE DE QUE:   Comprende estas instrucciones.  Controlar su afeccin.  Recibir ayuda de inmediato si no mejora o si empeora.   Esta informacin no tiene Marine scientist el consejo del mdico. Asegrese de hacerle al mdico cualquier pregunta que tenga.   Document Released:  04/29/2005 Document Revised: 07/25/2013 Elsevier Interactive Patient Education 2016 [employer]. SPOKE TO PATIENTS ENGLISH SPEAKING RELATIVE AND TOLD GO TO [place] 06/13/16 BETWEEN 9-9 TO HAVE U/S. GO THROUGH ER AND STATE HERE FOR U/S.  [place], [place], [place] [address]    IF you received an x-ray today, you will receive an invoice from [facility]  Radiology. Please contact [facility] at [phone] with questions or concerns regarding your invoice.   IF you received labwork today, you will receive an invoice from [employer]. Please contact [facility] at [phone] with questions or concerns regarding your invoice.   Our billing staff will not be able to assist you with questions regarding bills from these companies.  You will be contacted with the lab results as soon as they are available. The fastest way to get your results is to activate your My Chart account. Instructions are located on the last page of this paperwork. If you have not heard from [place] regarding the results in 2 weeks, please contact this office.

## 2016-06-13 ENCOUNTER — Telehealth: Payer: Self-pay

## 2016-06-13 LAB — RPR

## 2016-06-13 LAB — GC/CHLAMYDIA PROBE AMP
CT Probe RNA: NOT DETECTED
GC Probe RNA: NOT DETECTED

## 2016-06-13 NOTE — Telephone Encounter (Signed)
Mani ordered two STAT ultrasounds that need to be scheduled. I left a VM with pt to see if she is willing to have these preformed. If so, please let me know so that I can schedule and get a good call back number to relay the apt to. Thanks!

## 2016-06-15 LAB — PATHOLOGIST SMEAR REVIEW

## 2016-06-16 ENCOUNTER — Telehealth: Payer: Self-pay

## 2016-06-16 NOTE — Telephone Encounter (Signed)
Patient's daughter Joellen Jersey is returning a missed phone call from [place]. [phone]

## 2016-06-17 LAB — PAP IG AND HPV HIGH-RISK: HPV DNA HIGH RISK: NOT DETECTED

## 2016-06-23 NOTE — Telephone Encounter (Signed)
I have tried to reach patient's mother to coordinate her follow up multiple times now. Please have her daughter leave me a reliable time to call her.

## 2016-06-23 NOTE — Telephone Encounter (Signed)
LMOM for Joellen Jersey or [place] to [facility]

## 2016-06-23 NOTE — Telephone Encounter (Signed)
Pt CB with a Optometrist. She stated that 4:30 today would be a good time for [facility] to call. Also provided our office hrs Fri/Sat. Bess Harvest will prioritize this so you will see before 4:30.

## 2016-06-26 ENCOUNTER — Encounter: Payer: Self-pay | Admitting: Urgent Care

## 2016-06-26 NOTE — Telephone Encounter (Signed)
Multiple attempts were made to communicate with patient and her daughter regarding results and follow up. Will send a letter out.

## 2016-07-07 ENCOUNTER — Other Ambulatory Visit: Payer: Self-pay | Admitting: Urgent Care

## 2016-07-07 DIAGNOSIS — M545 Low back pain, unspecified: Secondary | ICD-10-CM

## 2016-07-07 DIAGNOSIS — N926 Irregular menstruation, unspecified: Secondary | ICD-10-CM

## 2016-07-09 NOTE — Telephone Encounter (Signed)
I provided a courtesy refill but patient needs to have an OV. I tried multiple times to contact patient and our next step in her evaluation was to obtain an U/S. This would really help [place] isolate the source of her irregular cycles.

## 2016-07-09 NOTE — Telephone Encounter (Signed)
06/12/16 last ov and refill

## 2016-07-10 NOTE — Telephone Encounter (Signed)
LMOM w/Mani's message to RTC.

## 2016-08-16 ENCOUNTER — Other Ambulatory Visit: Payer: Self-pay | Admitting: Family Medicine

## 2016-08-16 DIAGNOSIS — K219 Gastro-esophageal reflux disease without esophagitis: Secondary | ICD-10-CM

## 2016-08-16 DIAGNOSIS — R1013 Epigastric pain: Secondary | ICD-10-CM

## 2017-01-30 ENCOUNTER — Ambulatory Visit (INDEPENDENT_AMBULATORY_CARE_PROVIDER_SITE_OTHER): Payer: BLUE CROSS/BLUE SHIELD | Admitting: Urgent Care

## 2017-01-30 ENCOUNTER — Encounter: Payer: Self-pay | Admitting: Urgent Care

## 2017-01-30 VITALS — BP 154/90 | HR 70 | Temp 98.4°F | Resp 16 | Ht 61.0 in | Wt 185.4 lb

## 2017-01-30 DIAGNOSIS — R21 Rash and other nonspecific skin eruption: Secondary | ICD-10-CM

## 2017-01-30 DIAGNOSIS — R030 Elevated blood-pressure reading, without diagnosis of hypertension: Secondary | ICD-10-CM

## 2017-01-30 MED ORDER — METRONIDAZOLE 0.75 % EX GEL: 1.0000 "application " | Freq: Two times a day (BID) | CUTANEOUS | 0 refills | Status: DC

## 2017-01-30 MED ORDER — TRIAMCINOLONE ACETONIDE 0.1 % EX CREA: 1.0000 "application " | TOPICAL_CREAM | Freq: Two times a day (BID) | CUTANEOUS | 0 refills | Status: DC

## 2017-01-30 NOTE — Patient Instructions (Addendum)
Erupcin cutnea (Rash) Una erupcin cutnea es un cambio en el color de la piel. Una erupcin tambin puede cambiar la forma en que se siente la piel. Hay muchas afecciones y [employer] que pueden causar una erupcin. INSTRUCCIONES PARA EL CUIDADO EN EL HOGAR Est atento a cualquier cambio en los sntomas. Estas indicaciones pueden ayudarlo con el trastorno: [employer]rop [employer] o aplquese los medicamentos de venta libre y Editor, commissioning como se lo haya indicado el mdico. Estos pueden incluir lo siguiente:  Crema con corticoides.  Lociones para Barrister's clerk.  Antihistamnicos por va oral. Cuidado de la piel  Aplique compresas fras en las zonas afectadas.  Trate de tomar un bao con lo siguiente: ? Sales de [place]. Siga las instrucciones del envase. Puede conseguirlas en la tienda de comestibles o la farmacia local. ? Bicarbonato de sodio. Vierta un poco en la baera como se lo haya indicado el mdico. ? Avena coloidal. Siga las instrucciones del envase. Puede conseguirla en la tienda de comestibles o la farmacia local.  Intente colocarse una pasta de bicarbonato de sodio sobre la piel. Agregue agua al bicarbonato hasta que tenga la consistencia de una pasta.  No se rasque ni se refriegue la piel.  Evite cubrir la erupcin. Asegrese de que la erupcin est expuesta al aire todo lo posible. Instrucciones generales  Evite los baos de inmersin y las duchas calientes, que pueden empeorar la picazn. Ardelia Mems ducha fra puede aliviar.  Evite los detergentes y los jabones perfumados, y los perfumes. Utilice jabones, detergentes, perfumes y cosmticos suaves.  Evite las sustancias que causan la erupcin. Lleve un diario como ayuda para registrar lo que le causa erupcin. Escriba los siguientes datos: ? Lo que come. ? Los cosmticos que [place]. ? Lo que bebe. ? La ropa que [place]. [place] alhajas.  Concurra a todas las visitas de control como se lo haya  indicado el mdico. Esto es importante. SOLICITE ATENCIN MDICA SI:  Philbert Riser de noche.  Pierde peso.  Orina ms de lo normal.  Se siente dbil.  Vomita.  Tiene un color amarillo en la piel o en la zona blanca del ojo (ictericia).  La piel: ? Siente hormigueos. ? Se adormece.  La erupcin: ? No desaparece despus de [employer] Corporation. ? Empeora.  Usted: ? Est inusualmente sediento. ? Est ms cansado que lo habitual.  Tiene los siguientes sntomas: ? Sntomas nuevos. ? Dolor en el abdomen. ? Fiebre. ? Diarrea.  SOLICITE ATENCIN [place] DE INMEDIATO SI:  Presenta una erupcin que cubre todo el cuerpo o la mayor parte de [place]. La erupcin puede ser dolorosa o no.  Tiene ampollas que tienen las siguientes caractersticas: ? Se ubican sobre la erupcin. ? Se agrandan o crecen juntas. ? Son dolorosas. ? Estn dentro de la nariz o la boca.  Le aparece una erupcin que tiene las siguientes caractersticas: ? Tiene pequeas manchas moradas, como si fueran pinchazos, en todo el cuerpo. ? Tiene un aspecto parecido a Earna Coder o a un blanco de tiro. ? No est relacionada con una exposicin al sol, est enrojecida y duele, y produce descamacin de la piel.  Esta informacin no tiene Marine scientist el consejo del mdico. Asegrese de hacerle al mdico cualquier pregunta que tenga. Document Released: 04/29/2005 Document Revised: 11/11/2015 Document Reviewed: 12/05/2014 Elsevier Interactive Patient Education  2017 [employer].     IF you received an x-ray today, you will receive an invoice from [facility] Radiology. Please contact [facility] Radiology at  [phone] with questions or concerns regarding your invoice.   IF you received labwork today, you will receive an invoice from [facility]. Please contact [facility] at [phone] with questions or concerns regarding your invoice.   Our billing staff will not be able to assist you with questions regarding bills from  these companies.  You will be contacted with the lab results as soon as they are available. The fastest way to get your results is to activate your My Chart account. Instructions are located on the last page of this paperwork. If you have not heard from [place] regarding the results in 2 weeks, please contact this office.

## 2017-01-30 NOTE — Progress Notes (Signed)
  MRN: [id number] DOB: 10/25/1970  Subjective:   Elizabeth Barr is a 46 y.o. female presenting for chief complaint of Rash (neck for 1 week) and Hypertension  Rash - Reports 1 week history of itchy rash over her anterior neck, upper chest. Rash has also been over her mouth and part of her nose but has come and gone over this area. Has had this rash before, last episode was 3 months ago. Has tried Claritin and otc cream without any relief. She has not tried any new medications, new foods. She has kept her normal hygiene. She wears gold necklaces from time to time. Denies fever, sinus pain, ear pain, throat pain, chest pain, shob, n/v, abdominal pain, fatigue, depression. Denies history of thyroid disease.   Elizabeth Barr has a current medication list which includes the following prescription(s): diclofenac, naproxen sodium, and omeprazole. Also has No Known Allergies. Elizabeth Barr  has a past medical history of Anemia; Arthritis; and Hyperlipidemia. Also  has a past surgical history that includes Cesarean section.  Objective:   Vitals: BP (!) 154/90   Pulse 70   Temp 98.4 F (36.9 C) (Oral)   Resp 16   Ht 5\' 1"  (1.549 m)   Wt 185 lb 6.4 oz (84.1 kg)   LMP 01/17/2017   SpO2 97%   BMI 35.03 kg/m   BP Readings from Last 3 Encounters:  01/30/17 (!) 154/90  06/12/16 116/72  02/29/16 130/82    Physical Exam  Constitutional: She is oriented to person, place, and time. She appears well-developed and well-nourished.  HENT:  Mouth/Throat: Oropharynx is clear and moist.  Eyes: Right eye exhibits no discharge. Left eye exhibits no discharge.  Neck: Normal range of motion. Neck supple. No thyromegaly present.  Cardiovascular: Normal rate, regular rhythm and intact distal pulses.  Exam reveals no gallop and no friction rub.   No murmur heard. Pulmonary/Chest: No respiratory distress. She has no wheezes. She has no rales.  Musculoskeletal: She exhibits no edema.  Lymphadenopathy:    She has no  cervical adenopathy.  Neurological: She is alert and oriented to person, place, and time.  Skin: Skin is warm and dry. Rash (erythematous rash over middle chest with 2 areas of telangectasia both <0.5cm in size) noted.  Psychiatric: She has a normal mood and affect.   Assessment and Plan :   This case was precepted with Dr. Carlota Raspberry.   1. Rash and nonspecific skin eruption 2. Facial rash - Start triamcinolone cream with or without Flagyl gel. Labs pending.  3. Elevated blood pressure reading - Counseled on BP. Will recheck in 4 weeks. Return-to-clinic precautions discussed, patient verbalized understanding.   Jaynee Eagles, PA-C [facility] [phone] 01/30/2017  1:48 PM

## 2017-02-01 LAB — BASIC METABOLIC PANEL
BUN/Creatinine Ratio: 20 (ref 9–23)
BUN: 15 mg/dL (ref 6–24)
CALCIUM: 9.7 mg/dL (ref 8.7–10.2)
CO2: 23 mmol/L (ref 20–29)
CREATININE: 0.74 mg/dL (ref 0.57–1.00)
Chloride: 99 mmol/L (ref 96–106)
GFR calc Af Amer: 115 mL/min/{1.73_m2} (ref >59)
GFR, EST NON AFRICAN AMERICAN: 100 mL/min/{1.73_m2} (ref >59)
Glucose: 96 mg/dL (ref 65–99)
POTASSIUM: 4.3 mmol/L (ref 3.5–5.2)
Sodium: 138 mmol/L (ref 134–144)

## 2017-02-01 LAB — CBC
Hematocrit: 37 % (ref 34.0–46.6)
Hemoglobin: 11.5 g/dL (ref 11.1–15.9)
MCH: 23.8 pg — ABNORMAL LOW (ref 26.6–33.0)
MCHC: 31.1 g/dL — ABNORMAL LOW (ref 31.5–35.7)
MCV: 76 fL — ABNORMAL LOW (ref 79–97)
PLATELETS: 441 10*3/uL — AB (ref 150–379)
RBC: 4.84 x10E6/uL (ref 3.77–5.28)
RDW: 15.3 % (ref 12.3–15.4)
WBC: 11.4 10*3/uL — AB (ref 3.4–10.8)

## 2017-02-01 LAB — ANA W/REFLEX IF POSITIVE: ANA: NEGATIVE

## 2017-02-01 LAB — TSH: TSH: 0.149 u[IU]/mL — ABNORMAL LOW (ref 0.450–4.500)

## 2017-02-01 LAB — SEDIMENTATION RATE: SED RATE: 13 mm/h (ref 0–32)

## 2017-02-04 ENCOUNTER — Telehealth: Payer: Self-pay | Admitting: Family Medicine

## 2017-02-04 NOTE — Telephone Encounter (Signed)
PT CALLING FOR LAB RESULTS SHE SAW MANI

## 2017-02-05 ENCOUNTER — Telehealth: Payer: Self-pay | Admitting: *Deleted

## 2017-02-05 NOTE — Telephone Encounter (Signed)
Left message in voicemail advising patient Elizabeth Barr is out of town and another provider will review her labs. Some one will call with the results.

## 2017-02-05 NOTE — Telephone Encounter (Signed)
Elizabeth Barr, the patient is requesting results of labs. She is a patient of Mani.

## 2017-02-05 NOTE — Telephone Encounter (Signed)
Her labs were not telling of the rash that she had, or contribute to her blood pressure.  Please advise that they were normal, but tsh.  The t4 was normal, so there is a possible subclinical hypothyroidism.  She can return in 4 weeks to see [facility].

## 2017-02-05 NOTE — Telephone Encounter (Signed)
Called patient and spoke to daughter(Keyri) to translate, the daughter is on [place]. I explained lab results and the patient needs to make an appointment to see [facility] in 4 weeks.

## 2017-02-07 LAB — T4, FREE: Free T4: 0.98 ng/dL (ref 0.82–1.77)

## 2017-02-07 LAB — T3, FREE: T3 FREE: 3.4 pg/mL (ref 2.0–4.4)

## 2017-02-07 LAB — SPECIMEN STATUS REPORT

## 2017-03-19 NOTE — Telephone Encounter (Signed)
Entered in error

## 2017-05-13 LAB — HEMOGLOBIN A1C: HEMOGLOBIN A1C: 5.8

## 2017-05-29 ENCOUNTER — Encounter: Payer: Self-pay | Admitting: Family Medicine

## 2017-05-29 ENCOUNTER — Ambulatory Visit (INDEPENDENT_AMBULATORY_CARE_PROVIDER_SITE_OTHER): Payer: BLUE CROSS/BLUE SHIELD | Admitting: Family Medicine

## 2017-05-29 VITALS — BP 130/70 | HR 65 | Temp 97.9°F | Resp 16 | Ht 61.0 in | Wt 185.0 lb

## 2017-05-29 DIAGNOSIS — N938 Other specified abnormal uterine and vaginal bleeding: Secondary | ICD-10-CM

## 2017-05-29 DIAGNOSIS — Z0000 Encounter for general adult medical examination without abnormal findings: Secondary | ICD-10-CM | POA: Diagnosis not present

## 2017-05-29 DIAGNOSIS — D473 Essential (hemorrhagic) thrombocythemia: Secondary | ICD-10-CM | POA: Diagnosis not present

## 2017-05-29 DIAGNOSIS — R7989 Other specified abnormal findings of blood chemistry: Secondary | ICD-10-CM

## 2017-05-29 DIAGNOSIS — R7303 Prediabetes: Secondary | ICD-10-CM

## 2017-05-29 DIAGNOSIS — D649 Anemia, unspecified: Secondary | ICD-10-CM | POA: Diagnosis not present

## 2017-05-29 DIAGNOSIS — D75839 Thrombocytosis, unspecified: Secondary | ICD-10-CM

## 2017-05-29 LAB — POCT URINE PREGNANCY: Preg Test, Ur: NEGATIVE

## 2017-05-29 NOTE — Patient Instructions (Addendum)
IF you received an x-ray today, you will receive an invoice from [facility]. Please contact [facility] at [phone] with questions or concerns regarding your invoice.   IF you received labwork today, you will receive an invoice from [facility]. Please contact [facility] at [phone] with questions or concerns regarding your invoice.   Our billing staff will not be able to assist you with questions regarding bills from these companies.  You will be contacted with the lab results as soon as they are available. The fastest way to get your results is to activate your My Chart account. Instructions are located on the last page of this paperwork. If you have not heard from [place] regarding the results in 2 weeks, please contact this office.      Prevencin de la diabetes mellitus tipo2 (Preventing Type 2 Diabetes Mellitus) La diabetes tipo2 (diabetes mellitus tipo2) es una enfermedad a largo plazo (crnica) que afecta los niveles de azcar en la sangre (glucosa). Normalmente, una hormona llamada insulina estimula el ingreso de la glucosa en las clulas del cuerpo. Las clulas usan la glucosa para Dealer. En la diabetes tipo2, puede presentarse uno de los siguientes problemas, o ambos:  El organismo no produce la cantidad suficiente de [place].  El organismo no responde de [place] a la insulina que produce (resistencia a la insulina). La resistencia a la insulina o la falta de esta hormona hace que el exceso de glucosa se acumule en la sangre, en lugar de ir a las clulas. Como consecuencia, se desarrolla glucemia alta (hiperglucemia), que puede causar muchas complicaciones. El sobrepeso o la obesidad, y Catering manager un estilo de vida inactivo (sedentario) pueden aumentar el riesgo de tener diabetes. La diabetes tipo2 se puede retardar o evitar al realizar ciertos cambios en la alimentacin y en el estilo de vida. QU CAMBIOS EN LA ALIMENTACIN SE PUEDEN  HACER?  Consuma comidas y colaciones saludables regularmente. Lleve con usted una colacin saludable para cuando tenga [employer], por [place], una fruta o un puado de frutos secos.  Coma carne [place] y protenas con bajo contenido de grasas saturadas, como pollo, pescado, huevos blancos y frijoles. Evite las carnes procesadas.  Coma mucha fruta y verdura, y Ardelia Mems cantidad importante de cereales no procesados (cereales integrales). Se recomienda que consuma lo siguiente: ? De 1 a 2tazas de frutas [employer]. ? De 2 a 3tazas de [employer]. ? [place] (170g) a 8onzas (227g) de cereales integrales todos los [place], como avena, [place], trigo [place], arroz integral, quinua y mijo.  Productos lcteos con bajo contenido de [place], [place], yogur y [place].  Alimentos que contengan grasas saludables, como frutos secos, Musician, aceite de [place] y aceite de canola.  Beba agua [place]. Evite bebidas que contengan ms azcar, como gaseosas y t [place].  Siga las indicaciones del mdico con respecto a las restricciones especficas para las comidas o bebidas.  Controle la cantidad de comida que consume en un momento dado (tamao de la porcin). ? Revise las etiquetas de los alimentos para conocer el tamao de la porcin. ? Utilice una balanza de cocina para pesar las cantidades de alimentos.  Saltee o cocine al vapor los alimentos en vez de frerlos. Cocine con agua o caldo en vez de aceite o manteca.  Limite la ingesta de lo siguiente: ? Sal (sodio). No consuma ms de 1cucharadita (2400mg ) de sodio por da. Si tiene [place] cardiopata o hipertensin arterial,  consuma menos de  o de cucharadita (1500mg ) de Electrical engineer. ? Grasas saturadas. Es la grasa que se encuentra en estado slido a temperatura ambiente, como la [place] o la grasa de la carne. QU CAMBIOS EN EL ESTILO DE VIDA SE PUEDEN HACER? Actividad  Haga actividad fsica de  intensidad moderada durante al menos 96minutos como mnimo 5das por semana, o tanto como le haya indicado el mdico.  Pregntele al mdico qu actividades son seguras para usted. Una combinacin de actividades puede ser la mejor opcin, por ejemplo, caminar, practicar natacin, andar en bicicleta y hacer entrenamiento de fuerza.  Trate de agregar la actividad fsica a Conservation officer, nature. Por ejemplo: ? Estacione en lugares que estn ms alejados de lo habitual para poder caminar ms. Por ejemplo, estacione en una esquina alejada del estacionamiento cuando vaya a la oficina o a la tienda de comestibles. ? D una caminata durante su hora de almuerzo. ? Utilice las Clinical cytogeneticist del ascensor o de las escaleras mecnicas. Prdida de peso  Baje de peso segn se le indique. El mdico puede determinar cuntos kilos tiene que bajar y [place] a que adelgace de Geographical information systems officer segura.  Si tiene sobrepeso u obesidad, es posible que se le indique bajar, por lo menos del 5% al 7% del Engineer, site. Alcohol y tabaco  Limite el consumo de alcohol a no ms de 1 medida por da si es mujer y no est Music therapist, y 2 medidas por da si es hombre. Una medida equivale a 12onzas de cerveza, 5onzas de vino o 1onzas de bebidas alcohlicas de alta graduacin.  No consuma ningn producto que contenga tabaco, lo que incluye cigarrillos, tabaco de Higher education careers adviser y Psychologist, sport and exercise. Si necesita ayuda para dejar de fumar, consulte al [employer]. [place] con el mdico  Contrlese el nivel sanguneo de glucosa con frecuencia como se lo haya indicado el mdico.  Analice los factores de riesgo y cmo puede reducir el riesgo de tener diabetes.  Hgase las pruebas de [employer] se lo haya indicado el mdico. Puede hacerse pruebas de deteccin de forma peridica, especialmente si presenta ciertos factores de riesgo para la diabetes tipo2.  Haga una cita con un especialista en alimentacin y nutricin (nutricionista certificado). Un  nutricionista certificado puede ayudarlo a preparar un plan de alimentacin saludable, y a comprender los tamaos de las porciones y las etiquetas de los alimentos. POR QU ESTOS CAMBIOS SON IMPORTANTES?  Al hacer cambios en el estilo de vida y la alimentacin, es posible prevenir o retardar la diabetes tipo2 y los problemas de salud relacionados.  Puede ser difcil reconocer los signos de la diabetes tipo2. La mejor manera de evitar los posibles daos al organismo es tomar medidas para prevenir la enfermedad antes de presentar sntomas. QU PUEDE SUCEDER SI NO SE REALIZAN CAMBIOS?  Los niveles sanguneos de glucosa pueden seguir aumentando. Es peligroso Systems analyst glucemia alta durante mucho tiempo. Demasiada glucosa en la sangre puede daar los vasos sanguneos, el corazn, los riones, los nervios y los ojos.  Puede desarrollar prediabetes o diabetes tipo2. La diabetes tipo2 puede producir muchos problemas de salud crnicos y complicaciones, por ejemplo: ? Cardiopata. ? Ictus. ? Ceguera. ? Enfermedad renal. ? Depresin. ? Mala [employer] y en las piernas, que podra llevar a la extraccin quirrgica (amputacin) en casos graves. DNDE ENCONTRAR ASISTENCIA:  Pdale al mdico que le recomiende a un nutricionista certificado, a Radio broadcast assistant para el cuidado de la diabetes o un programa para Sports coach de  peso.  Busque grupos para bajar de peso locales o en lnea.  Inscrbase en un gimnasio, club de preparacin fsica o grupo de actividades al [employer], [place] un club para salir a Writer. DNDE ENCONTRAR MS INFORMACIN: Para obtener ms informacin sobre la diabetes y la prevencin de la diabetes, visite los siguientes sitios web:  Asociacin Americana de la Diabetes (American Diabetes Association, ADA): www.diabetes.[place] Diabetes y las Enfermedades Digestivas y [place] [facility] of Diabetes and Digestive and Kidney Diseases):  FindSpin.nl Para obtener ms informacin sobre una alimentacin saludable, visite los siguientes sitios web:  Choose My Plate (MiPlato), Departamento de Agricultura de EE.UU. (U.S. Department of Agriculture, Engineer, structural): http://wiley-williams.com/  [employer] Dietary Guidelines (Pautas de Designer, multimedia) de la Oficina de Prevencin de Enfermedades y Promocin de Technical sales engineer (Office of Disease Prevention and Health Promotion, [place]): SurferLive.at Resumen  Puede reducir el riesgo de desarrollar diabetes tipo2 al aumentar la actividad fsica, comer alimentos saludables y Sports coach de [place], segn se le indique.  Hable con el mdico sobre el riesgo de desarrollar diabetes tipo2. Pregntele [employer]  de sangre o las pruebas de deteccin que deba [place]. Esta informacin no tiene Marine scientist el consejo del mdico. Asegrese de hacerle al mdico cualquier pregunta que tenga. Document Released: 09/10/2015 Document Revised: 09/10/2015 Document Reviewed: 09/10/2015 Elsevier Interactive Patient Education  [employer].

## 2017-05-29 NOTE — Progress Notes (Signed)
10/27/20188:47 AM  Elizabeth Barr [birth year], 46 y.o. female [id number]  Chief Complaint  Patient presents with  . Annual Exam    BP concerns and prolonged menstrual cycle    HPI:   Patient is a 46 y.o. female who presents today for annual physical exam.  Had labs done on the 18th of Oct at work CMP normal except glucose 105 HgbA1c 5.8, it was 5.6 last year LDL 144, HDL 40 TSH 0.02, last year labs supported subclinical hyprethyroidism CBC hgb 11.1, Hct normal, MCV 72.7, Plts 417, similar to last years  G4P4 Has been having irregular heavy menses for about the last year LMP Aug 28th, lasted until 9/17 Pap 2017, normal Not on [facility]   Depression screen [facility] 2/9 05/29/2017 06/12/2016 02/29/2016  Decreased Interest 0 0 0  Down, Depressed, Hopeless 1 0 0  PHQ - 2 Score 1 0 0    No Known Allergies  Prior to Admission medications   Medication Sig Start Date End Date Taking? Authorizing Provider  diclofenac (VOLTAREN) 75 MG EC tablet Take 75 mg by mouth 2 (two) times daily.   Yes [provider]  omeprazole (PRILOSEC) 20 MG capsule Take 20 mg by mouth daily.   Yes [provider]    Past Medical History:  Diagnosis Date  . Anemia   . Arthritis   . Hyperlipidemia     Past Surgical History:  Procedure Laterality Date  . CESAREAN SECTION      Social History  Substance Use Topics  . Smoking status: Never Smoker  . Smokeless tobacco: Never Used  . Alcohol use No    Family History  Problem Relation Age of Onset  . Hyperlipidemia Sister     Review of Systems  Constitutional: Negative for chills, fever, malaise/fatigue and weight loss.  HENT: Negative for congestion, ear pain and sore throat.   Eyes: Positive for blurred vision. Negative for pain and discharge.  Respiratory: Negative for cough and shortness of breath.   Cardiovascular: Positive for palpitations. Negative for chest pain and leg swelling.  Gastrointestinal: Positive for  constipation and heartburn. Negative for abdominal pain, blood in stool, diarrhea, melena, nausea and vomiting.  Genitourinary: Negative for dysuria, frequency and hematuria.       Neg: pelvic pain, dyspareunia, vaginal discharge, breast lumps or nipple discharge Positive: hot flashes, mood swings  Musculoskeletal: Negative for myalgias and neck pain.  Neurological: Negative for dizziness, tingling and focal weakness.  Endo/Heme/Allergies: Negative for polydipsia.  Psychiatric/Behavioral: Positive for depression. Negative for suicidal ideas. The patient is nervous/anxious.      OBJECTIVE:  Blood pressure 130/70, pulse 65, temperature 97.9 F (36.6 C), temperature source Oral, resp. rate 16, height 5\' 1"  (1.549 m), weight 185 lb (83.9 kg), last menstrual period 03/30/2017, SpO2 100 %,   Physical Exam  Constitutional: She is oriented to person, place, and time and well-developed, well-nourished, and in no distress.  HENT:  Head: Normocephalic and atraumatic.  Right Ear: Hearing, tympanic membrane, external ear and ear canal normal.  Left Ear: Hearing, tympanic membrane, external ear and ear canal normal.  Mouth/Throat: Oropharynx is clear and moist. No oropharyngeal exudate.  Eyes: Pupils are equal, round, and reactive to light. EOM are normal. No scleral icterus.  Neck: Neck supple. No thyromegaly present.  Cardiovascular: Normal rate, regular rhythm and normal heart sounds.  Exam reveals no gallop and no friction rub.   No murmur heard. Pulmonary/Chest: Effort normal and breath sounds normal. She has no wheezes.  She has no rales.  Abdominal: Soft. Bowel sounds are normal. She exhibits no distension and no mass. There is no tenderness.  Musculoskeletal: She exhibits no edema.  Lymphadenopathy:    She has no cervical adenopathy.  Neurological: She is alert and oriented to person, place, and time. She has normal reflexes. Gait normal.  Skin: Skin is warm and dry.  Psychiatric: Mood and  affect normal.    Results for orders placed or performed in visit on 05/29/17 (from the past 24 hour(s))  POCT urine pregnancy     Status: Normal   Collection Time: 05/29/17  9:22 AM  Result Value Ref Range   Preg Test, Ur Negative Negative    ASSESSMENT and PLAN  1. Annual physical exam Discussed importance of low carb diet, regular exercise and healthy weight. HCM reviewed and discussed.   2. Abnormal thyroid blood test Subclinical hyperthyroid last year, rechecking labs, consider endo referral - TSH - T4, Free - T3, Free  3. Prediabetes A1c 5.8, see above  4. Anemia, unspecified type Mild. Most likely iron deficiency from menstrual loss, starting with iron studies. Discussed iron rich foods. - CBC with Differential - Iron, TIBC and Ferritin Panel  5. DUB (dysfunctional uterine bleeding) Might be related to thyroid dysfunction, perimenopause, fibroids, hyperplasia. Will start workup. Consider referral to Gyn for endometrial bx if unable to do in clinic. - US PELVIC COMPLETE WITH TRANSVAGINAL; Future - POCT urine pregnancy  6. Thrombocytosis ([place]) Might be reactive, pending iron studies. Consider peripheral smear for further evaluation.  Return in about 4 weeks (around 06/26/2017).    Rutherford Guys, MD [facility] [place], [place] [address] Ph.  [phone] Fax [phone]

## 2017-05-30 LAB — IRON,TIBC AND FERRITIN PANEL
Ferritin: 21 ng/mL (ref 15–150)
Iron Saturation: 7 % — CL (ref 15–55)
Iron: 31 ug/dL (ref 27–159)
Total Iron Binding Capacity: 428 ug/dL (ref 250–450)
UIBC: 397 ug/dL (ref 131–425)

## 2017-05-30 LAB — T4, FREE: Free T4: 1.01 ng/dL (ref 0.82–1.77)

## 2017-05-30 LAB — CBC WITH DIFFERENTIAL/PLATELET
Basophils Absolute: 0 10*3/uL (ref 0.0–0.2)
Basos: 0 %
EOS (ABSOLUTE): 0.2 10*3/uL (ref 0.0–0.4)
Eos: 2 %
Hematocrit: 34.6 % (ref 34.0–46.6)
Hemoglobin: 11.1 g/dL (ref 11.1–15.9)
Immature Grans (Abs): 0 10*3/uL (ref 0.0–0.1)
Immature Granulocytes: 0 %
Lymphocytes Absolute: 3.4 10*3/uL — ABNORMAL HIGH (ref 0.7–3.1)
Lymphs: 36 %
MCH: 23.5 pg — ABNORMAL LOW (ref 26.6–33.0)
MCHC: 32.1 g/dL (ref 31.5–35.7)
MCV: 73 fL — ABNORMAL LOW (ref 79–97)
Monocytes Absolute: 0.7 10*3/uL (ref 0.1–0.9)
Monocytes: 7 %
Neutrophils Absolute: 5.4 10*3/uL (ref 1.4–7.0)
Neutrophils: 55 %
Platelets: 384 10*3/uL — ABNORMAL HIGH (ref 150–379)
RBC: 4.72 x10E6/uL (ref 3.77–5.28)
RDW: 15.6 % — ABNORMAL HIGH (ref 12.3–15.4)
WBC: 9.7 10*3/uL (ref 3.4–10.8)

## 2017-05-30 LAB — T3, FREE: T3, Free: 3.2 pg/mL (ref 2.0–4.4)

## 2017-05-30 LAB — TSH: TSH: 0.014 u[IU]/mL — ABNORMAL LOW (ref 0.450–4.500)

## 2017-06-01 NOTE — Addendum Note (Signed)
Addended by: Rutherford Guys on: 06/01/2017 10:24 AM   Modules accepted: Level of Service

## 2017-07-02 ENCOUNTER — Ambulatory Visit: Payer: BLUE CROSS/BLUE SHIELD | Admitting: Family Medicine

## 2017-07-02 ENCOUNTER — Other Ambulatory Visit: Payer: Self-pay

## 2017-07-02 ENCOUNTER — Encounter: Payer: Self-pay | Admitting: Family Medicine

## 2017-07-02 VITALS — BP 145/90 | HR 61 | Temp 98.2°F | Resp 18 | Ht 61.26 in | Wt 184.6 lb

## 2017-07-02 DIAGNOSIS — R7303 Prediabetes: Secondary | ICD-10-CM | POA: Insufficient documentation

## 2017-07-02 DIAGNOSIS — N938 Other specified abnormal uterine and vaginal bleeding: Secondary | ICD-10-CM

## 2017-07-02 DIAGNOSIS — E0590 Thyrotoxicosis, unspecified without thyrotoxic crisis or storm: Secondary | ICD-10-CM

## 2017-07-02 DIAGNOSIS — N951 Menopausal and female climacteric states: Secondary | ICD-10-CM | POA: Diagnosis not present

## 2017-07-02 DIAGNOSIS — I10 Essential (primary) hypertension: Secondary | ICD-10-CM | POA: Diagnosis not present

## 2017-07-02 MED ORDER — LISINOPRIL 10 MG PO TABS: 10.0000 mg | ORAL_TABLET | Freq: Every day | ORAL | 1 refills | Status: DC

## 2017-07-02 NOTE — Patient Instructions (Addendum)
IF you received an x-ray today, you will receive an invoice from [facility]. Please contact [facility] at [phone] with questions or concerns regarding your invoice.   IF you received labwork today, you will receive an invoice from [place]. Please contact [facility] at [phone] with questions or concerns regarding your invoice.   Our billing staff will not be able to assist you with questions regarding bills from these companies.  You will be contacted with the lab results as soon as they are available. The fastest way to get your results is to activate your My Chart account. Instructions are located on the last page of this paperwork. If you have not heard from [place] regarding the results in 2 weeks, please contact this office.    Perimenopausia (Perimenopause) La perimenopausia es el momento en que su cuerpo comienza a pasar a la menopausia (sin menstruacin durante 12 meses consecutivos). Es un proceso natural. La perimenopausia puede comenzar entre 2 y 53 aos antes de la menopausia y por lo general tiene una duracin de 1 ao ms pasada la menopausia. [employer], los ovarios podran producir un vulo o no. Los ovarios varan su produccin de las hormonas estrgeno y Technical brewer. Esto puede causar perodos menstruales irregulares, dificultad para quedar embarazada, hemorragia vaginal entre perodos y sntomas incmodos. CAUSAS  Produccin irregular de las hormonas ovricas estrgeno y Immunologist, y no ovular todos los meses.  Otras causas son: ? Tumor de la glndula pituitaria. ? Enfermedades que [employer] ovarios. ? Radioterapia. ? Quimioterapia. ? Causas desconocidas. ? Fumar mucho y abusar del consumo de alcohol puede llevar a que la perimenopausia aparezca antes.  SIGNOS Y SNTOMAS  Acaloramiento.  Sudoracin nocturna.  Perodos menstruales irregulares.  Disminucin del deseo sexual.  Sequedad vaginal.  Dolores de  cabeza.  Cambios en el estado de nimo.  Depresin.  Problemas de memoria.  Irritabilidad.  Cansancio.  Aumento de [place].  Problemas para quedar embarazada.  Prdida de clulas seas (osteoporosis).  Comienzo de endurecimiento de las arterias (aterosclerosis).  DIAGNSTICO El mdico realizar un diagnstico en funcin de su edad, historial de perodos menstruales y sntomas. Le realizarn un examen fsico para ver si hay algn cambio en su cuerpo, en especial en sus rganos reproductores. Las pruebas hormonales pueden ser o no tiles segn la cantidad de hormonas femeninas que produzca y [employer] produzca. Sin embargo, podrn Microbiologist pruebas hormonales para Statistician. TRATAMIENTO En algunos casos, no se necesita tratamiento. La decisin acerca de qu tratamiento es necesario durante la perimenopausia deber realizarse en conjunto con su mdico segn cmo estn afectando los sntomas a su estilo de vida. Existen varios tratamientos disponibles, como:  Risk manager cada sntoma individual con medicamentos especficos para ese sntoma.  Algunos medicamentos herbales pueden ayudar en sntomas especficos.  Psicoterapia.  Terapia grupal. INSTRUCCIONES PARA EL CUIDADO EN EL HOGAR  Controle sus periodos menstruales (cundo ocurren, qu tan abundantes son, cunto tiempo pasa entre perodos, y cunto duran) como tambin sus sntomas y cundo comenzaron.  Tome slo medicamentos de venta libre o recetados, segn las indicaciones del mdico.  Duerma y descanse.  Haga actividad fsica.  Consuma una dieta que contenga calcio (bueno para los [place]) y productos derivados de la soja (actan como estrgenos).  No fume.  Evite las bebidas alcohlicas.  Tome los suplementos vitamnicos segn las indicaciones del mdico. En ciertos casos, puede ser de [place] tomar vitamina E.  Tome suplementos de calcio y vitamina D para  ayudar a prevenir la prdida sea.  En algunos  casos la terapia de grupo podr ayudarla.  La acupuntura puede ser de ayuda en ciertos casos.  SOLICITE ATENCIN MDICA SI:  Tiene preguntas acerca de sus sntomas.  Necesita ser derivada a un especialista (gineclogo, psiquiatra, o psiclogo).  SOLICITE ATENCIN MDICA DE INMEDIATO SI:  Sufre una hemorragia vaginal abundante.  Su perodo menstrual dura ms de 8 das.  Sus perodos son recurrentes cada menos de [address].  Tiene hemorragias durante las [employer].  Est muy deprimido.  Siente dolor al [employer].  Siente dolor de cabeza intenso.  Tiene problemas de visin.  Esta informacin no tiene Marine scientist el consejo del mdico. Asegrese de hacerle al mdico cualquier pregunta que tenga. Document Released: 07/20/2005 Document Revised: 05/10/2013 Document Reviewed: 02/16/2013 Elsevier Interactive Patient Education  2017 Century de alimentacin DASH (DASH Eating Plan) DASH es la sigla en ingls de "Enfoques Alimentarios para Detener la Hipertensin". El plan de alimentacin DASH ha demostrado bajar la presin arterial elevada (hipertensin). Los beneficios adicionales para la salud pueden incluir la disminucin del riesgo de diabetes mellitus tipo2, enfermedades cardacas e ictus. Este plan tambin puede ayudar a Horticulturist, commercial. QU DEBO SABER ACERCA DEL PLAN DE ALIMENTACIN DASH? Para el plan de alimentacin DASH, seguir las siguientes pautas generales:  Elija los alimentos que contienen menos de 150 miligramos de sodio por porcin (segn se indica en la etiqueta de los alimentos).  Use hierbas o aderezos sin sal, en lugar de sal de mesa o sal marina.  Consulte al mdico o farmacutico antes de usar sustitutos de la sal.  Consuma los productos con menor contenido de sodio. Estos productos suelen estar etiquetados como "bajo en sodio" o "sin agregado de sal".  Coma alimentos frescos. No consuma una gran cantidad de alimentos enlatados.  Coma ms  verduras, frutas y productos lcteos con bajo contenido de [place].  Elija los cereales integrales. Busque la palabra "integral" en Equities trader de la lista de ingredientes.  Elija el pescado y el pollo o el pavo sin piel ms a menudo que las carnes rojas. Limite el consumo de pescado, carne de ave y carne a 6onzas (170g) por Training and development officer.  Limite el consumo de dulces, postres, azcares y bebidas azucaradas.  Elija las grasas saludables para el corazn.  Consuma ms comida casera y menos de restaurante, de buf y comida rpida.  Limite el consumo de alimentos fritos.  No fra los alimentos. A la hora de cocinarlos, opte por hornearlos, hervirlos, grillarlos y asarlos a Administrator, arts.  Cuando coma en un restaurante, pida que preparen su comida con menos sal o, en lo posible, sin nada de sal. QU ALIMENTOS PUEDO COMER? Pida ayuda a un nutricionista para conocer las necesidades calricas individuales. Cereales Pan de salvado o integral. Arroz integral. Pastas de salvado o integrales. Quinua, trigo burgol y cereales integrales. Cereales con bajo contenido de sodio. Tortillas de harina de maz o de salvado. Pan de maz integral. Galletas saladas integrales. Galletas con bajo contenido de [place]. Vegetales Verduras frescas o congeladas (crudas, al vapor, asadas o grilladas). Jugos de tomate y verduras con contenido bajo o reducido de sodio. Pasta y salsa de tomate con contenido bajo o [place]. Verduras enlatadas con bajo contenido de sodio o reducido de sodio. Lambert Mody Lambert Mody frescas, en conserva (en su jugo natural) o frutas congeladas. Carnes y otros productos con protenas Carne de res molida (al 85% o ms [place]), carne de res de  animales alimentados con pastos o carne de res sin la grasa. Pollo o pavo sin piel. Carne de pollo o de [place]. Cerdo sin la grasa. Todos los pescados y frutos de mar. Huevos. Porotos, guisantes o lentejas secos. Frutos secos y semillas sin sal. Frijoles  enlatados sin sal. Lcteos Productos lcteos con bajo contenido de grasas, como [place] o al 1%, quesos reducidos en grasas o al 2%, ricota con bajo contenido de grasas o [employer], o yogur natural con bajo contenido de [place]. Quesos con contenido bajo o reducido de sodio. Grasas y Naval architect en barra que no contengan grasas trans. [place] y alios para ensaladas livianos o reducidos en grasas (reducidos en sodio). [place]. Aceites de crtamo, oliva o canola. [place] natural de man o almendra. Otros Palomitas de maz y pretzels sin sal. Los artculos mencionados arriba pueden no ser [employer] de las bebidas o los alimentos recomendados. Comunquese con el nutricionista para conocer ms opciones. QU ALIMENTOS NO SE RECOMIENDAN? Cereales Pan blanco. Pastas blancas. Arroz blanco. Pan de maz refinado. Bagels y croissants. Galletas saladas que contengan grasas trans. Vegetales Vegetales con crema o fritos. Verduras en [place]. Verduras enlatadas comunes. Pasta y salsa de tomate en lata comunes. Jugos comunes de tomate y de verduras. Lambert Mody Fruta enlatada en almbar liviano o espeso. Jugo de frutas. Carnes y otros productos con protenas [place] de carne con Lobbyist. Costillas, alas de pollo, tocineta, salchicha, mortadela, salame, chinchulines, tocino, perros calientes, salchichas alemanas y embutidos envasados. Frutos secos y semillas con sal. Frijoles con sal en lata. Lcteos Leche entera o al 2%, crema, mezcla de [place] y crema, y queso crema. Yogur entero o endulzado. Quesos o queso azul con alto contenido de Physicist, medical. [place]remas no lcteas y coberturas batidas. Quesos procesados, quesos para untar o cuajadas. Condimentos Sal de cebolla y ajo, sal condimentada, sal de mesa y sal marina. Salsas en lata y envasadas. Salsa [place]shire. Salsa trtara. Salsa barbacoa. Salsa teriyaki. Salsa de soja, incluso la que tiene contenido reducido de [place]. Salsa de  carne. Salsa de pescado. Salsa de [place]. Salsa rosada. Rbano picante. Ketchup y mostaza. Saborizantes y tiernizantes para carne. Caldo en cubitos. Salsa picante. Salsa tabasco. Adobos. Aderezos para tacos. Salsas. Grasas y aceites [place], [place] en barra, [place], [place], [place] clarificada y Wendee Copp de tocino. Aceites de coco, de palmiste o de palma. Aderezos comunes para ensalada. Otros Pickles y [place]. Palomitas de maz y pretzels con sal. Los artculos mencionados arriba pueden no ser [employer] Foods Company de las bebidas y los alimentos que se Higher education careers adviser. Comunquese con el nutricionista para obtener ms informacin. DNDE Dolan Amen MS INFORMACIN? [place], del Pulmn y de Herbalist (National Heart, Lung, and [place]): travelstabloid.com Esta informacin no tiene Marine scientist el consejo del mdico. Asegrese de hacerle al mdico cualquier pregunta que tenga. Document Released: 07/09/2011 Document Revised: 11/11/2015 Document Reviewed: 05/24/2013 Elsevier Interactive Patient Education  2017 [employer].

## 2017-07-02 NOTE — Progress Notes (Signed)
11/30/20184:35 PM  RIELY OETKEN Sep 20, 1970, 46 y.o. female [id number]  Chief Complaint  Patient presents with  . Follow-up    HPI:   Patient is a 46 y.o. female who presents today for follow-up on DUB. During last visit she was reporting very heavy menstrual bleeding, recurring. She did not have pelvic US done. Since then her menstrual bleeding as significantly improved. She states that she is barely spotting. She is also having hot flashes and mood swings.   Otherwise she has no acute concerns today.  Depression screen [facility] 2/9 07/02/2017 05/29/2017 06/12/2016  Decreased Interest 1 0 0  Down, Depressed, Hopeless 1 1 0  PHQ - 2 Score 2 1 0  Altered sleeping 1 - -  Tired, decreased energy 1 - -  Change in appetite 1 - -  Feeling bad or failure about yourself  0 - -  Trouble concentrating 0 - -  Moving slowly or fidgety/restless 0 - -  Suicidal thoughts 0 - -  PHQ-9 Score 5 - -  Difficult doing work/chores Not difficult at all - -    No Known Allergies  Prior to Admission medications   Medication Sig Start Date End Date Taking? Authorizing Provider  diclofenac (VOLTAREN) 75 MG EC tablet Take 75 mg by mouth 2 (two) times daily.   Yes [provider]  omeprazole (PRILOSEC) 20 MG capsule Take 20 mg by mouth daily.   Yes [provider]    Past Medical History:  Diagnosis Date  . Anemia   . Arthritis   . Hyperlipidemia     Past Surgical History:  Procedure Laterality Date  . CESAREAN SECTION      Social History   Tobacco Use  . Smoking status: Never Smoker  . Smokeless tobacco: Never Used  Substance Use Topics  . Alcohol use: No    Family History  Problem Relation Age of Onset  . Hyperlipidemia Sister     Review of Systems  Constitutional: Negative for chills and fever.  Respiratory: Negative for cough and shortness of breath.   Cardiovascular: Negative for chest pain, palpitations and leg swelling.  Gastrointestinal: Negative for  abdominal pain, nausea and vomiting.     OBJECTIVE:  Blood pressure (!) 143/84, pulse 61, temperature 98.2 F (36.8 C), temperature source Oral, resp. rate 18, height 5' 1.26" (1.556 m), weight 184 lb 9.6 oz (83.7 kg), last menstrual period 06/27/2017, SpO2 99 %, unknown if currently breastfeeding.  BP Readings from Last 3 Encounters:  07/02/17 (!) 143/84  05/29/17 130/70  01/30/17 (!) 154/90    Physical Exam  Constitutional: She is oriented to person, place, and time and well-developed, well-nourished, and in no distress.  HENT:  Head: Normocephalic and atraumatic.  Mouth/Throat: Mucous membranes are normal.  Eyes: EOM are normal. Pupils are equal, round, and reactive to light. No scleral icterus.  Neck: Neck supple.  Pulmonary/Chest: Effort normal.  Neurological: She is alert and oriented to person, place, and time. Gait normal.  Skin: Skin is warm and dry.  Psychiatric: Mood and affect normal.  Nursing note and vitals reviewed.  Recent Results (from the past 2160 hour(s))  Hemoglobin A1c     Status: None   Collection Time: 05/13/17 12:00 AM  Result Value Ref Range   Hemoglobin A1C 5.8   POCT urine pregnancy     Status: Normal   Collection Time: 05/29/17  9:22 AM  Result Value Ref Range   Preg Test, Ur Negative Negative  TSH  Status: Abnormal   Collection Time: 05/29/17  9:45 AM  Result Value Ref Range   TSH 0.014 (L) 0.450 - 4.500 uIU/mL  T4, Free     Status: None   Collection Time: 05/29/17  9:45 AM  Result Value Ref Range   Free T4 1.01 0.82 - 1.77 ng/dL  T3, Free     Status: None   Collection Time: 05/29/17  9:45 AM  Result Value Ref Range   T3, Free 3.2 2.0 - 4.4 pg/mL  CBC with Differential     Status: Abnormal   Collection Time: 05/29/17  9:45 AM  Result Value Ref Range   WBC 9.7 3.4 - 10.8 x10E3/uL   RBC 4.72 3.77 - 5.28 x10E6/uL   Hemoglobin 11.1 11.1 - 15.9 g/dL   Hematocrit 34.6 34.0 - 46.6 %   MCV 73 (L) 79 - 97 fL   MCH 23.5 (L) 26.6 - 33.0  pg   MCHC 32.1 31.5 - 35.7 g/dL   RDW 15.6 (H) 12.3 - 15.4 %   Platelets 384 (H) 150 - 379 x10E3/uL   Neutrophils 55 Not Estab. %   Lymphs 36 Not Estab. %   Monocytes 7 Not Estab. %   Eos 2 Not Estab. %   Basos 0 Not Estab. %   Neutrophils Absolute 5.4 1.4 - 7.0 x10E3/uL   Lymphocytes Absolute 3.4 (H) 0.7 - 3.1 x10E3/uL   Monocytes Absolute 0.7 0.1 - 0.9 x10E3/uL   EOS (ABSOLUTE) 0.2 0.0 - 0.4 x10E3/uL   Basophils Absolute 0.0 0.0 - 0.2 x10E3/uL   Immature Granulocytes 0 Not Estab. %   Immature Grans (Abs) 0.0 0.0 - 0.1 x10E3/uL  Iron, TIBC and Ferritin Panel     Status: Abnormal   Collection Time: 05/29/17  9:45 AM  Result Value Ref Range   Total Iron Binding Capacity 428 250 - 450 ug/dL   UIBC 397 131 - 425 ug/dL   Iron 31 27 - 159 ug/dL   Iron Saturation 7 (LL) 15 - 55 %   Ferritin 21 15 - 150 ng/mL    ASSESSMENT and PLAN 1. Perimenopause It seems that patient is entering perimenopause given constellation of symptoms, discussed management options, patient not interested in medication at this time. Patient educational handout given.  2. Subclinical hyperthyroidism TSH improved, overall stable. Continue to monitor. RTC precautions discussed.   3. DUB (dysfunctional uterine bleeding) Significantly improved flow, seems to be related to perimenopause. Continue to monitor clinically. RTC precautions given.   4. Essential hypertension, benign Patient with multiple elevated readings, discussed treatment options, new med r/se/b and lifestyle modifications reviewed. Patient educational handout given. - lisinopril (PRINIVIL,ZESTRIL) 10 MG tablet; Take 1 tablet (10 mg total) by mouth daily.  Return in about 4 weeks (around 07/30/2017) for hypertension.    Rutherford Guys, MD Primary Care at [place], [place] [address] Ph.  [phone] Fax [phone]

## 2017-07-03 ENCOUNTER — Encounter: Payer: Self-pay | Admitting: Family Medicine

## 2017-08-05 ENCOUNTER — Other Ambulatory Visit: Payer: Self-pay

## 2017-08-05 ENCOUNTER — Encounter: Payer: Self-pay | Admitting: Family Medicine

## 2017-08-05 ENCOUNTER — Ambulatory Visit: Payer: BLUE CROSS/BLUE SHIELD | Admitting: Family Medicine

## 2017-08-05 VITALS — BP 128/72 | HR 80 | Temp 98.4°F | Ht 61.0 in | Wt 182.6 lb

## 2017-08-05 DIAGNOSIS — K219 Gastro-esophageal reflux disease without esophagitis: Secondary | ICD-10-CM | POA: Diagnosis not present

## 2017-08-05 DIAGNOSIS — Z23 Encounter for immunization: Secondary | ICD-10-CM | POA: Diagnosis not present

## 2017-08-05 DIAGNOSIS — I10 Essential (primary) hypertension: Secondary | ICD-10-CM | POA: Diagnosis not present

## 2017-08-05 MED ORDER — LISINOPRIL 10 MG PO TABS: 10.0000 mg | ORAL_TABLET | Freq: Every day | ORAL | 1 refills | Status: DC

## 2017-08-05 MED ORDER — OMEPRAZOLE 20 MG PO CPDR: 20.0000 mg | DELAYED_RELEASE_CAPSULE | Freq: Every day | ORAL | 1 refills | Status: DC

## 2017-08-05 NOTE — Progress Notes (Signed)
   1/3/20195:28 PM  KRISSIA SCHREIER June 05, 1971, 47 y.o. female [id number]  Chief Complaint  Patient presents with  . Follow-up    BLOOD PRESSURE F/U    HPI:   Patient is a 47 y.o. female with past medical history significant for HTN  who presents today for follow-up.   She is tolerating BP med well. Denies any side effects. Aware not compatible with pregnancy, currently not sexually active. She is requesting refill of omeprazole, takes for GERD, uses prn, maybe 1-2 a week.  Has no other concerns today  Depression screen [facility] 2/9 08/05/2017 07/02/2017 05/29/2017  Decreased Interest 0 1 0  Down, Depressed, Hopeless 0 1 1  PHQ - 2 Score 0 2 1  Altered sleeping - 1 -  Tired, decreased energy - 1 -  Change in appetite - 1 -  Feeling bad or failure about yourself  - 0 -  Trouble concentrating - 0 -  Moving slowly or fidgety/restless - 0 -  Suicidal thoughts - 0 -  PHQ-9 Score - 5 -  Difficult doing work/chores - Not difficult at all -    No Known Allergies  Prior to Admission medications   Medication Sig Start Date End Date Taking? Authorizing Provider  lisinopril (PRINIVIL,ZESTRIL) 10 MG tablet Take 1 tablet (10 mg total) by mouth daily. 07/02/17  Yes Rutherford Guys, MD    Past Medical History:  Diagnosis Date  . Anemia   . Arthritis   . Hyperlipidemia     Past Surgical History:  Procedure Laterality Date  . CESAREAN SECTION      Social History   Tobacco Use  . Smoking status: Never Smoker  . Smokeless tobacco: Never Used  Substance Use Topics  . Alcohol use: No    Family History  Problem Relation Age of Onset  . Hyperlipidemia Sister     ROS Per hpi  OBJECTIVE:  Blood pressure 128/72, pulse 80, temperature 98.4 F (36.9 C), temperature source Oral, height 5\' 1"  (1.549 m), weight 182 lb 9.6 oz (82.8 kg), SpO2 96 %, unknown if currently breastfeeding.  Physical Exam  Constitutional: She is oriented to person, place, and time and well-developed,  well-nourished, and in no distress.  HENT:  Head: Normocephalic and atraumatic.  Mouth/Throat: Mucous membranes are normal.  Eyes: EOM are normal. Pupils are equal, round, and reactive to light. No scleral icterus.  Neck: Neck supple.  Pulmonary/Chest: Effort normal.  Neurological: She is alert and oriented to person, place, and time. Gait normal.  Skin: Skin is warm and dry.  Psychiatric: Mood and affect normal.  Nursing note and vitals reviewed.   ASSESSMENT and PLAN  1. Essential hypertension, benign At goal, cont current regime.  2. Gastroesophageal reflux disease, esophagitis presence not specified Controlled with omeprazole prn.   3. Need for prophylactic vaccination with combined diphtheria-tetanus-pertussis (DTP) vaccine - Td vaccine greater than or equal to 7yo preservative free IM  Other orders - lisinopril (PRINIVIL,ZESTRIL) 10 MG tablet; Take 1 tablet (10 mg total) by mouth daily. - omeprazole (PRILOSEC) 20 MG capsule; Take 1 capsule (20 mg total) by mouth daily.  Return in about 6 months (around 02/02/2018).    Rutherford Guys, MD Primary Care at [place], [place] [address] Ph.  [phone] Fax [phone]

## 2017-08-05 NOTE — Patient Instructions (Signed)
     IF you received an x-ray today, you will receive an invoice from [facility]. Please contact [facility] at [phone] with questions or concerns regarding your invoice.   IF you received labwork today, you will receive an invoice from [facility]. Please contact [facility] at [phone] with questions or concerns regarding your invoice.   Our billing staff will not be able to assist you with questions regarding bills from these companies.  You will be contacted with the lab results as soon as they are available. The fastest way to get your results is to activate your My Chart account. Instructions are located on the last page of this paperwork. If you have not heard from us regarding the results in 2 weeks, please contact this office.     

## 2018-03-04 ENCOUNTER — Ambulatory Visit: Payer: BLUE CROSS/BLUE SHIELD | Admitting: Family Medicine

## 2018-03-04 ENCOUNTER — Other Ambulatory Visit: Payer: Self-pay

## 2018-03-04 ENCOUNTER — Encounter: Payer: Self-pay | Admitting: Family Medicine

## 2018-03-04 VITALS — BP 138/83 | HR 66 | Temp 98.1°F | Ht 60.83 in | Wt 185.0 lb

## 2018-03-04 DIAGNOSIS — R7303 Prediabetes: Secondary | ICD-10-CM | POA: Diagnosis not present

## 2018-03-04 DIAGNOSIS — I10 Essential (primary) hypertension: Secondary | ICD-10-CM

## 2018-03-04 DIAGNOSIS — E0590 Thyrotoxicosis, unspecified without thyrotoxic crisis or storm: Secondary | ICD-10-CM

## 2018-03-04 DIAGNOSIS — K219 Gastro-esophageal reflux disease without esophagitis: Secondary | ICD-10-CM

## 2018-03-04 DIAGNOSIS — Z6835 Body mass index (BMI) 35.0-35.9, adult: Secondary | ICD-10-CM | POA: Diagnosis not present

## 2018-03-04 MED ORDER — LISINOPRIL 10 MG PO TABS: 10.0000 mg | ORAL_TABLET | Freq: Every day | ORAL | 1 refills | Status: DC

## 2018-03-04 MED ORDER — OMEPRAZOLE 20 MG PO CPDR: 20.0000 mg | DELAYED_RELEASE_CAPSULE | Freq: Every day | ORAL | 1 refills | Status: DC

## 2018-03-04 NOTE — Progress Notes (Signed)
8/2/201911:59 AM  Steve Rattler Oct 05, 1970, 47 y.o. female [id number]  Chief Complaint  Patient presents with  . Hypertension    needs refill on Lisinopril    HPI:   Patient is a 47 y.o. female with past medical history significant for HTH, pre-DM, subclincal hyperthyroidism and GERD who presents today for routine follpwup  Takes all meds as prescribed Does not check BP at home Not exercising regularly and does not follow low carb diet GERD well controlled with diet, takes PPI very prn She denies any acute concerns today  Fall Risk  03/04/2018 08/05/2017 07/02/2017 05/29/2017 06/12/2016  Falls in the past year? No No No No No     Depression screen [facility] 2/9 03/04/2018 08/05/2017 07/02/2017  Decreased Interest 0 0 1  Down, Depressed, Hopeless 0 0 1  PHQ - 2 Score 0 0 2  Altered sleeping - - 1  Tired, decreased energy - - 1  Change in appetite - - 1  Feeling bad or failure about yourself  - - 0  Trouble concentrating - - 0  Moving slowly or fidgety/restless - - 0  Suicidal thoughts - - 0  PHQ-9 Score - - 5  Difficult doing work/chores - - Not difficult at all    No Known Allergies  Prior to Admission medications   Medication Sig Start Date End Date Taking? Authorizing Provider  lisinopril (PRINIVIL,ZESTRIL) 10 MG tablet Take 1 tablet (10 mg total) by mouth daily. 08/05/17   Rutherford Guys, MD  omeprazole (PRILOSEC) 20 MG capsule Take 1 capsule (20 mg total) by mouth daily. 08/05/17   Rutherford Guys, MD    Past Medical History:  Diagnosis Date  . Anemia   . Arthritis   . GERD (gastroesophageal reflux disease)   . HTN (hypertension)   . Hyperlipidemia   . Pre-diabetes   . Subclinical hyperthyroidism     Past Surgical History:  Procedure Laterality Date  . CESAREAN SECTION      Social History   Tobacco Use  . Smoking status: Never Smoker  . Smokeless tobacco: Never Used  Substance Use Topics  . Alcohol use: No    Family History  Problem Relation Age of  Onset  . Hyperlipidemia Sister     Review of Systems  Constitutional: Positive for malaise/fatigue. Negative for chills, fever and weight loss.  Respiratory: Negative for cough and shortness of breath.   Cardiovascular: Negative for chest pain, palpitations and leg swelling.  Gastrointestinal: Negative for abdominal pain, constipation, diarrhea, nausea and vomiting.     OBJECTIVE:  Blood pressure 138/83, pulse 66, temperature 98.1 F (36.7 C), temperature source Oral, height 5' 0.83" (1.545 m), weight 185 lb (83.9 kg), last menstrual period 01/19/2018, SpO2 100 %, unknown if currently breastfeeding. Body mass index is 35.15 kg/m.   Wt Readings from Last 3 Encounters:  03/04/18 185 lb (83.9 kg)  08/05/17 182 lb 9.6 oz (82.8 kg)  07/02/17 184 lb 9.6 oz (83.7 kg)    Physical Exam  Constitutional: She is oriented to person, place, and time. She appears well-developed and well-nourished.  HENT:  Head: Normocephalic and atraumatic.  Mouth/Throat: Oropharynx is clear and moist. No oropharyngeal exudate.  Eyes: Pupils are equal, round, and reactive to light. EOM are normal. No scleral icterus.  Neck: Neck supple.  Cardiovascular: Normal rate, regular rhythm and normal heart sounds. Exam reveals no gallop and no friction rub.  No murmur heard. Pulmonary/Chest: Effort normal and breath sounds normal. She has no  wheezes. She has no rales.  Musculoskeletal: She exhibits no edema.  Neurological: She is alert and oriented to person, place, and time.  Skin: Skin is warm and dry.  Psychiatric: She has a normal mood and affect.  Nursing note and vitals reviewed.   ASSESSMENT and PLAN  1. Essential hypertension, benign Controlled. Continue current regime.   2. Subclinical hyperthyroidism Checking labs today, medications will be started if needed.  - TSH - T4, free  3. Prediabetes Checking labs today, medications will be started if needed. Discussed importance of low carb diet,  regular exercise and healthy weight.  - Hemoglobin A1c  4. BMI 35.0-35.9,adult See #3  5. Gastroesophageal reflux disease, esophagitis presence not specified Controlled. Continue current regime.   Other orders - lisinopril (PRINIVIL,ZESTRIL) 10 MG tablet; Take 1 tablet (10 mg total) by mouth daily. - omeprazole (PRILOSEC) 20 MG capsule; Take 1 capsule (20 mg total) by mouth daily.  Return in about 6 months (around 09/04/2018).    Rutherford Guys, MD Primary Care at [facility] [place], [place] [address] Ph.  [phone] Fax [phone]

## 2018-03-04 NOTE — Patient Instructions (Addendum)
Next pap 06/2021   IF you received an x-ray today, you will receive an invoice from [facility]. Please contact [facility] at [phone] with questions or concerns regarding your invoice.   IF you received labwork today, you will receive an invoice from [facility]. Please contact [facility] at [phone] with questions or concerns regarding your invoice.   Our billing staff will not be able to assist you with questions regarding bills from these companies.  You will be contacted with the lab results as soon as they are available. The fastest way to get your results is to activate your My Chart account. Instructions are located on the last page of this paperwork. If you have not heard from [place] regarding the results in 2 weeks, please contact this office.     Recuento de caloras para bajar de peso Calorie Counting for [employer] Loss Las caloras son unidades de Teacher, early years/pre. Su cuerpo necesita una cierta cantidad de caloras de los alimentos para que le ayuden a Company secretary. Cuando come ms caloras de las que el cuerpo necesita, este acumula las caloras extra [place]. Cuando come [employer] de las que el cuerpo [place], este quema grasa para obtener la energa que requiere. El recuento de caloras es el registro de la cantidad de caloras que come y Pharmacologist. El recuento de caloras puede ser de ayuda si necesita perder peso. Si se asegura de comer menos caloras de las que el cuerpo necesita, debe bajar de [place]. Pregntele al mdico cul es un peso sano para usted. Para que el recuento de caloras funcione, tendr que comer la cantidad de caloras adecuadas para usted en un da, para bajar una cantidad de peso saludable por semana. Un nutricionista puede determinar la cantidad de caloras que necesita por da y sugerirle cmo alcanzar su objetivo calrico.  Una cantidad de peso saludable para bajar por semana suele ser Basile 1 y Ivar Drape (0,5 a 0,9kg). Esto significa  con frecuencia que su ingesta diaria de caloras se debera reducir unas 500 a 750caloras.  Ingerir 1200 a 1500caloras por Administrator, Civil Service a la [place] a Administrator, Civil Service.  Ingerir de 1500 a 1800caloras por Administrator, Civil Service a la [employer] de los hombres a Administrator, Civil Service.  En qu consiste el plan? Mi objetivo es comer __________ Raenette Rover da. Si como esta cantidad de caloras por da, debo bajar unas __________ Terrall Laity. Qu debo saber acerca del recuento de caloras? A fin de alcanzar su objetivo diario de caloras, tendr que:  Averiguar cuntas caloras hay en cada alimento que le Therapist, occupational. Intente hacerlo antes de comer.  Decida la cantidad que puede comer del alimento.  Anote lo que comi y cuntas caloras tena. Esta tarea se conoce como llevar un registro de comidas.  Para perder peso con xito es importante equilibrar el recuento de caloras con un estilo de vida saludable que incluya actividad fsica de forma regular. Tenga un objetivo de 133minutos de ejercicio moderado (como caminar) o 75 minutos de ejercicio vigoroso (como correr) todas las semanas. Dnde encuentro informacin sobre las caloras?  Es posible Animator cantidad de caloras que contiene un alimento en la etiqueta de informacin nutricional. Si un alimento no tiene una etiqueta de informacin nutricional, intente buscar las caloras en Internet o pida ayuda al nutricionista. Recuerde que las caloras se calculan por porcin. Si opta por comer ms de una porcin de un alimento, tendr [employer] caloras por porcin  por la cantidad de porciones que planea comer. Por ejemplo, la etiqueta de un envase de pan puede decir que el tamao de una porcin es [place], y que una porcin tiene 90caloras. Si come 1rodaja, habr comido 90caloras. Si come 2rodajas, habr comido 180caloras. Cmo llevo un registro de comidas? Despus de cada comida, registre la siguiente  informacin en el registro de comidas:  Lo que comi. No olvide incluir los aderezos, las salsas y otros extras de la comida.  La cantidad que comi. Esto se puede medir en tazas, onzas o cantidad de alimentos.  Cuntas caloras ingiri por comida y por bebida.  La cantidad total de caloras en la comida.  Tenga a Materials engineer de comidas, por ejemplo, en un anotador de bolsillo o utilice una aplicacin mvil o sitio web. Algunos programas calcularn las caloras y Automotive engineer la cantidad de caloras que le quedan para llegar al objetivo diario. Cules son algunos consejos para el recuento de caloras?  Use las caloras de los alimentos y las bebidas que lo sacien y no lo dejen con apetito: ? Algunos ejemplos de alimentos que lo sacian son los frutos secos y Engineer, mining de frutos secos, verduras, Advertising account planner y Clinical research associate con alto contenido de Pharmacist, hospital como los cereales integrales. Los alimentos con alto contenido de [place] son aquellos que tienen ms de 5g de fibra por porcin. ? Las [employer] refrescos, especialmente las bebidas a base de caf y los jugos, que contienen muchas caloras, pero no le dan saciedad.  Coma alimentos nutritivos y evite las caloras vacas. Las caloras vacas son aquellas que se obtienen de los alimentos o las bebidas que no contienen muchos nutrientes ni protenas, como los dulces y los refrescos. Es mejor comer una comida nutritiva altamente calrica (como un aguacate) que una con pocos nutrientes (como una bolsa de patatas fritas).  Sepa cuntas caloras tienen los alimentos que come con ms frecuencia. Esto le ayudar a contar las caloras ms rpidamente.  Preste atencin a las [employer] Data. Las bebidas de bajas caloras incluyen agua y refrescos sin Location manager.  Preste atencin a las etiquetas nutricionales de alimentos "bajos en grasas" o "sin grasas". Estos alimentos a veces tienen la misma cantidad de caloras o ms caloras que las  versiones ricas en grasa. Con frecuencia, tambin tienen agregados de azcar, almidn o sal, para darles el sabor que fue eliminado con la grasa.  Encuentre un mtodo para controlar las caloras que funcione para usted. Sea creativo. Pruebe aplicaciones o programas distintos, si llevar un registro de las caloras no funciona para usted. Cules son algunos consejos para controlar las porciones?  Sepa cuntas caloras hay en una porcin. Esto lo ayudar a saber cuntas porciones de un alimento determinado puede comer.  Use una taza medidora para medir los tamaos de las porciones. Tambin Secondary school teacher las porciones en una balanza de cocina. Con el tiempo, podr hacer un clculo estimativo de los tamaos de las porciones de algunos alimentos.  Dedique tiempo a poner porciones de diferentes alimentos en sus platos, tazones y tazas predilectos, a fin de saber cmo se ve una porcin.  Intente no comer directamente de una bolsa o una caja. Esto puede llevarlo a comer en exceso. Ponga la cantidad [employer] gustara comer en una taza o un plato, a fin de asegurarse de que est comiendo la porcin correcta.  Use platos, vasos y tazones ms pequeos para no comer en exceso.  Intente no realizar varias tareas  al mismo tiempo (como mirar la TV o usar su computadora) [employer] come. Si es la hora de comer, sintese a Conservation officer, nature y disfrute de Environmental education officer. Esto lo ayudar a Marine scientist cundo est satisfecho. Tambin le ayudar a tomar conciencia de lo que est comiendo y de la cantidad. Cules son algunos consejos para seguir este plan? Lectura de las etiquetas de los alimentos  Controle el recuento de caloras en comparacin con el tamao de la porcin. El tamao de la porcin puede ser ms pequeo de lo que suele comer.  Verifique la fuente de las caloras. Asegrese de que la comida que ingiere tenga alto contenido de vitaminas y protenas y sea baja en grasas saturadas y grasas trans. De compras  Lea las  etiquetas nutricionales cuando compre. Esto le ayudar a tomar decisiones ms saludables antes de comprar [employer].  Haga una lista para el almacn y resptela. La coccin  Intente cocinar sus alimentos preferidos de una manera ms saludable. Por ejemplo, pruebe hornear en vez de frer.  Utilice productos lcteos descremados. Planificacin de los alimentos  Utilice ms frutas y verduras. La mitad de sus platos debe ser de frutas y verduras.  Incluya protenas [employer]ee y el pescado. Cmo puedo hacer el recuento de caloras cuando como afuera?  Pida porciones ms pequeas.  Considere la posibilidad de Publishing rights manager un plato principal y las guarniciones, en lugar de pedir su propio plato principal.  Si pide su propio plato principal, coma solo la mitad. Pida una caja al comienzo de la comida y ponga all el resto del plato principal, para no sentir la tentacin de comerlo.  Si se detallan las caloras en el men, elija las opciones que contengan la menor cantidad.  Elija platos que incluyan verduras, frutas, cereales integrales, productos lcteos con bajo contenido de grasa y Advertising account planner.  Opte por los alimentos hervidos, asados, cocidos a la parrilla o al vapor. No coma alimentos que contengan mantequilla, estn empanados, fritos o que se sirvan con salsa a base de crema. Generalmente, los alimentos que se etiquetan como "crujientes" estn fritos, a menos que se indique lo contrario.  Elija el agua, la [place], [place] t helado sin azcar u otras bebidas que no contengan azcares agregados. Si desea una bebida alcohlica, escoja una opcin con menos caloras como una copa de vino o una cerveza ligera.  Ordene los [employer], las salsas y los jarabes aparte. Estos son, con frecuencia, de alto contenido en caloras, por lo que debe limitar la cantidad que ingiere.  Si desea Katherine Mantle, elija una de hortalizas y pida carnes a la parrilla. Evite las guarniciones  adicionales como el tocino, el queso o los alimentos fritos. Ordene el aderezo aparte o pida aceite de [place] y vinagre o limn para Haematologist.  Haga un clculo estimativo de la cantidad de porciones que le sirven. Por ejemplo, una porcin de arroz cocido equivale a media taza o la mitad del tamao de una pelota de bisbol. Conocer el tamao de las porciones lo ayudar a Personnel officer atento a la cantidad de comida que come [employer] Petroleum. La lista que sigue le [place] el tamao de algunas porciones comunes a partir de objetos cotidianos: ? 1onza (28g) = 4dados apilados. ? 3onzas (85g) = 7mazo de cartas. ? 1cucharadita = 1dado. ? 1cucharada = media pelota de tenis de mesa. ? 2cucharadas = 1pelota de tenis de mesa. ? Media taza = media pelota de bisbol. ? 1taza = 1 pelota de  bisbol. Resumen  El recuento de caloras es el registro de la cantidad de caloras que come y Pharmacologist. Si come menos caloras de las que el cuerpo necesita, debe bajar de [place].  Una cantidad de peso saludable para bajar por semana suele ser Ruth 1 y Ivar Drape (0,5 a 0,9kg). Esto significa, con frecuencia, reducir su ingesta diaria de caloras unas 500 a 750 caloras.  Es posible Animator cantidad de caloras que contiene un alimento en la etiqueta de informacin nutricional. Si un alimento no tiene una etiqueta de informacin nutricional, intente buscar las caloras en Internet o pida ayuda al nutricionista.  Use las caloras de los alimentos y las bebidas que lo sacien y no de los alimentos y las bebidas que lo dejan con apetito.  Use platos, vasos y tazones ms pequeos para no comer en exceso. Esta informacin no tiene Marine scientist el consejo del mdico. Asegrese de hacerle al mdico cualquier pregunta que tenga. Document Released: 11/05/2008 Document Revised: 10/19/2016 Document Reviewed: 10/19/2016 Elsevier Interactive Patient Education  [employer].

## 2018-03-05 LAB — HEMOGLOBIN A1C
Est. average glucose Bld gHb Est-mCnc: 123 mg/dL
Hgb A1c MFr Bld: 5.9 % — ABNORMAL HIGH (ref 4.8–5.6)

## 2018-03-05 LAB — TSH: TSH: 1.47 u[IU]/mL (ref 0.450–4.500)

## 2018-03-05 LAB — T4, FREE: Free T4: 0.86 ng/dL (ref 0.82–1.77)

## 2018-03-13 ENCOUNTER — Encounter: Payer: Self-pay | Admitting: Family Medicine

## 2018-03-29 ENCOUNTER — Other Ambulatory Visit: Payer: Self-pay | Admitting: Family Medicine

## 2018-03-29 NOTE — Telephone Encounter (Signed)
Omeprazole 20 mg  refill Last Refill:03/04/08 # 30 with 1 refill Last OV: 03/04/18 PCP: Macarthur Critchley [facility] [phone]  Too soon for refill.

## 2018-05-03 ENCOUNTER — Other Ambulatory Visit: Payer: Self-pay | Admitting: Family Medicine

## 2018-05-03 NOTE — Telephone Encounter (Signed)
Requested Prescriptions  Pending Prescriptions Disp Refills  . omeprazole (PRILOSEC) 20 MG capsule [Pharmacy Med Name: OMEPRAZOLE DR 20 MG CAPSULE] 30 capsule 1    Sig: TOME UNA CAPSULA TODOS LOS DIAS     Gastroenterology: Proton Pump Inhibitors Passed - 05/03/2018  9:33 AM      Passed - Valid encounter within last 12 months    Recent Outpatient Visits          2 months ago Essential hypertension, benign   [facility], Lilia Argue, MD   9 months ago Essential hypertension, benign   [facility], Lilia Argue, MD   10 months ago Perimenopause   [facility], Lilia Argue, MD   11 months ago Annual physical exam   [facility], Lilia Argue, MD   1 year ago Rash and nonspecific skin eruption   Primary Care at [facility], [place], [place]      Future Appointments            In 4 months Pamella Pert, Lilia Argue, MD [facility] at [place], [facility]

## 2018-05-17 ENCOUNTER — Other Ambulatory Visit: Payer: Self-pay | Admitting: Family Medicine

## 2018-09-06 ENCOUNTER — Other Ambulatory Visit: Payer: Self-pay | Admitting: Family Medicine

## 2018-09-06 NOTE — Telephone Encounter (Signed)
Requested Prescriptions  Pending Prescriptions Disp Refills  . lisinopril (PRINIVIL,ZESTRIL) 10 MG tablet [Pharmacy Med Name: LISINOPRIL 10 MG TABLET] 90 tablet 1    Sig: TOME UNA TABLETA TODOS LOS DIAS     Cardiovascular:  ACE Inhibitors Failed - 09/06/2018  2:17 AM      Failed - Cr in normal range and within 180 days    Creat  Date Value Ref Range Status  02/29/2016 0.61 0.50 - 1.10 mg/dL Final   Creatinine, Ser  Date Value Ref Range Status  01/30/2017 0.74 0.57 - 1.00 mg/dL Final         Failed - K in normal range and within 180 days    Potassium  Date Value Ref Range Status  01/30/2017 4.3 3.5 - 5.2 mmol/L Final         Failed - Valid encounter within last 6 months    Recent Outpatient Visits          6 months ago Essential hypertension, benign   [facility], Lilia Argue, MD   1 year ago Essential hypertension, benign   [facility], Lilia Argue, MD   1 year ago Perimenopause   [facility], Lilia Argue, MD   1 year ago Annual physical exam   [facility], Lilia Argue, MD   1 year ago Rash and nonspecific skin eruption   Primary Care at [facility], [place], [place]      Future Appointments            In 1 month Rutherford Guys, MD Primary Care at [place], [place] - Patient is not pregnant      Passed - Last BP in normal range    BP Readings from Last 1 Encounters:  03/04/18 138/83

## 2018-09-09 ENCOUNTER — Ambulatory Visit: Payer: Self-pay | Admitting: Family Medicine

## 2018-10-14 ENCOUNTER — Other Ambulatory Visit: Payer: Self-pay | Admitting: Family Medicine

## 2018-10-14 ENCOUNTER — Ambulatory Visit: Payer: BLUE CROSS/BLUE SHIELD | Admitting: Family Medicine

## 2018-10-14 ENCOUNTER — Encounter: Payer: Self-pay | Admitting: Family Medicine

## 2018-10-14 ENCOUNTER — Other Ambulatory Visit: Payer: Self-pay

## 2018-10-14 VITALS — BP 132/78 | HR 84 | Temp 100.0°F | Resp 18 | Ht 60.39 in | Wt 178.2 lb

## 2018-10-14 DIAGNOSIS — R52 Pain, unspecified: Secondary | ICD-10-CM | POA: Diagnosis not present

## 2018-10-14 DIAGNOSIS — J101 Influenza due to other identified influenza virus with other respiratory manifestations: Secondary | ICD-10-CM | POA: Diagnosis not present

## 2018-10-14 DIAGNOSIS — E0590 Thyrotoxicosis, unspecified without thyrotoxic crisis or storm: Secondary | ICD-10-CM

## 2018-10-14 DIAGNOSIS — R7303 Prediabetes: Secondary | ICD-10-CM

## 2018-10-14 DIAGNOSIS — I10 Essential (primary) hypertension: Secondary | ICD-10-CM | POA: Diagnosis not present

## 2018-10-14 LAB — POCT INFLUENZA A/B
Influenza A, POC: POSITIVE — AB
Influenza B, POC: NEGATIVE

## 2018-10-14 MED ORDER — LISINOPRIL 10 MG PO TABS: ORAL_TABLET | ORAL | 1 refills | Status: DC

## 2018-10-14 MED ORDER — BENZONATATE 100 MG PO CAPS: 100.0000 mg | ORAL_CAPSULE | Freq: Three times a day (TID) | ORAL | 0 refills | Status: DC | PRN

## 2018-10-14 MED ORDER — OMEPRAZOLE 20 MG PO CPDR: 20.0000 mg | DELAYED_RELEASE_CAPSULE | Freq: Every day | ORAL | 2 refills | Status: DC

## 2018-10-14 MED ORDER — OSELTAMIVIR PHOSPHATE 75 MG PO CAPS: 75.0000 mg | ORAL_CAPSULE | Freq: Two times a day (BID) | ORAL | 0 refills | Status: DC

## 2018-10-14 NOTE — Patient Instructions (Signed)
° ° ° °  If you have lab work done today you will be contacted with your lab results within the next 2 weeks.  If you have not heard from us then please contact us. The fastest way to get your results is to register for My Chart. ° ° °IF you received an x-ray today, you will receive an invoice from [facility]. Please contact [facility] at [phone] with questions or concerns regarding your invoice.  ° °IF you received labwork today, you will receive an invoice from [facility]. Please contact [facility] at [phone] with questions or concerns regarding your invoice.  ° °Our billing staff will not be able to assist you with questions regarding bills from these companies. ° °You will be contacted with the lab results as soon as they are available. The fastest way to get your results is to activate your My Chart account. Instructions are located on the last page of this paperwork. If you have not heard from us regarding the results in 2 weeks, please contact this office. °  ° ° ° °

## 2018-10-14 NOTE — Telephone Encounter (Signed)
Pharmacy sent back message that 100 mg is on backorder and is requesting to refill 200 mg tessalon

## 2018-10-14 NOTE — Progress Notes (Signed)
3/13/20202:38 PM  Elizabeth Barr Jul 11, 1971, 48 y.o. female [id number]  Chief Complaint  Patient presents with  . Hypertension  . Headache    X 1 day  . Generalized Body Aches    X 1 day    HPI:   Patient is a 48 y.o. female with past medical history significant for HTN, subclinical hyperthyroidism, GERD prediabetes, who presents today for routine followup  Last OV aug 2019 Yesterday started with body aches, headaches, chills, cough, fatigue No CP, SOB, nausea, vomiting, diarrhea Needs refills of meds Uses PPI prn for GERD Takes BP med daily Has been working diet, eating lots of vegetables, salads, avoiding carbs  Wt Readings from Last 3 Encounters:  10/14/18 178 lb 3.2 oz (80.8 kg)  03/04/18 185 lb (83.9 kg)  08/05/17 182 lb 9.6 oz (82.8 kg)    Fall Risk  10/14/2018 03/04/2018 08/05/2017 07/02/2017 05/29/2017  Falls in the past year? 0 No No No No  Number falls in past yr: 0 - - - -  Injury with Fall? 0 - - - -  Follow up Falls evaluation completed - - - -     Depression screen [facility] 2/9 10/14/2018 03/04/2018 08/05/2017  Decreased Interest 0 0 0  Down, Depressed, Hopeless 0 0 0  PHQ - 2 Score 0 0 0  Altered sleeping - - -  Tired, decreased energy - - -  Change in appetite - - -  Feeling bad or failure about yourself  - - -  Trouble concentrating - - -  Moving slowly or fidgety/restless - - -  Suicidal thoughts - - -  PHQ-9 Score - - -  Difficult doing work/chores - - -    No Known Allergies  Prior to Admission medications   Medication Sig Start Date End Date Taking? Authorizing Provider  lisinopril (PRINIVIL,ZESTRIL) 10 MG tablet TOME UNA TABLETA TODOS LOS DIAS 09/06/18  Yes Rutherford Guys, MD  omeprazole (PRILOSEC) 20 MG capsule Take 1 capsule (20 mg total) by mouth daily. 03/04/18  Yes Rutherford Guys, MD  omeprazole (PRILOSEC) 20 MG capsule TOME UNA CAPSULA TODOS LOS DIAS 05/17/18  Yes Rutherford Guys, MD    Past Medical History:  Diagnosis Date  . Anemia    . Arthritis   . GERD (gastroesophageal reflux disease)   . HTN (hypertension)   . Hyperlipidemia   . Pre-diabetes   . Subclinical hyperthyroidism     Past Surgical History:  Procedure Laterality Date  . CESAREAN SECTION      Social History   Tobacco Use  . Smoking status: Never Smoker  . Smokeless tobacco: Never Used  Substance Use Topics  . Alcohol use: No    Family History  Problem Relation Age of Onset  . Hyperlipidemia Sister     ROS Per hpi  OBJECTIVE:  Blood pressure 132/78, pulse 84, temperature 100 F (37.8 C), temperature source Oral, resp. rate 18, height 5' 0.39" (1.534 m), weight 178 lb 3.2 oz (80.8 kg), last menstrual period 09/23/2018, SpO2 97 %, not currently breastfeeding. Body mass index is 34.35 kg/m.   Physical Exam Vitals signs and nursing note reviewed.  Constitutional:      Appearance: She is well-developed.  HENT:     Head: Normocephalic and atraumatic.     Right Ear: Hearing, tympanic membrane, ear canal and external ear normal.     Left Ear: Hearing, tympanic membrane, ear canal and external ear normal.  Eyes:     Conjunctiva/sclera:  Conjunctivae normal.     Pupils: Pupils are equal, round, and reactive to light.  Neck:     Musculoskeletal: Neck supple.  Cardiovascular:     Rate and Rhythm: Normal rate and regular rhythm.     Heart sounds: Normal heart sounds. No murmur. No friction rub. No gallop.   Pulmonary:     Effort: Pulmonary effort is normal.     Breath sounds: Normal breath sounds. No wheezing or rales.  Lymphadenopathy:     Cervical: No cervical adenopathy.  Skin:    General: Skin is warm and dry.  Neurological:     Mental Status: She is alert and oriented to person, place, and time.     Results for orders placed or performed in visit on 10/14/18 (from the past 24 hour(s))  POCT Influenza A/B     Status: Abnormal   Collection Time: 10/14/18  2:29 PM  Result Value Ref Range   Influenza A, POC Positive (A)  Negative   Influenza B, POC Negative Negative    ASSESSMENT and PLAN  1. Influenza A  2. Generalized body aches Discussed supportive measures, new meds r/se/b and RTC precautions. - POCT Influenza A/B  3. Essential hypertension, benign Controlled. Continue current regime.  - Comprehensive metabolic panel - Lipid panel  4. Subclinical hyperthyroidism Checking labs today. - TSH - T4, Free  5. Prediabetes Checking labs today. Continue with healthier life style and weight loss.  - Hemoglobin A1c - Lipid panel  Other orders - oseltamivir (TAMIFLU) 75 MG capsule; Take 1 capsule (75 mg total) by mouth 2 (two) times daily. - benzonatate (TESSALON) 100 MG capsule; Take 1-2 capsules (100-200 mg total) by mouth 3 (three) times daily as needed for cough. - lisinopril (PRINIVIL,ZESTRIL) 10 MG tablet; TOME UNA TABLETA TODOS LOS DIAS - omeprazole (PRILOSEC) 20 MG capsule; Take 1 capsule (20 mg total) by mouth daily.    Return for 6 mths- routine visit, Nov - pap.    Rutherford Guys, MD Primary [facility] at [facility], [place] [address] Ph.  [phone] Fax [phone]

## 2018-10-15 LAB — T4, FREE: Free T4: 0.93 ng/dL (ref 0.82–1.77)

## 2018-10-15 LAB — HEMOGLOBIN A1C
Est. average glucose Bld gHb Est-mCnc: 120 mg/dL
Hgb A1c MFr Bld: 5.8 % — ABNORMAL HIGH (ref 4.8–5.6)

## 2018-10-15 LAB — COMPREHENSIVE METABOLIC PANEL
ALT: 11 IU/L (ref 0–32)
AST: 13 IU/L (ref 0–40)
Albumin/Globulin Ratio: 1.4 (ref 1.2–2.2)
Albumin: 4.5 g/dL (ref 3.8–4.8)
Alkaline Phosphatase: 91 IU/L (ref 39–117)
BUN/Creatinine Ratio: 16 (ref 9–23)
BUN: 12 mg/dL (ref 6–24)
Bilirubin Total: <0.2 mg/dL (ref 0.0–1.2)
CO2: 25 mmol/L (ref 20–29)
Calcium: 9.7 mg/dL (ref 8.7–10.2)
Chloride: 98 mmol/L (ref 96–106)
Creatinine, Ser: 0.76 mg/dL (ref 0.57–1.00)
GFR calc Af Amer: 110 mL/min/{1.73_m2} (ref >59)
GFR calc non Af Amer: 95 mL/min/{1.73_m2} (ref >59)
Globulin, Total: 3.2 g/dL (ref 1.5–4.5)
Glucose: 92 mg/dL (ref 65–99)
Potassium: 4.4 mmol/L (ref 3.5–5.2)
Sodium: 139 mmol/L (ref 134–144)
Total Protein: 7.7 g/dL (ref 6.0–8.5)

## 2018-10-15 LAB — LIPID PANEL
Chol/HDL Ratio: 4.3 ratio (ref 0.0–4.4)
Cholesterol, Total: 197 mg/dL (ref 100–199)
HDL: 46 mg/dL (ref >39)
LDL Calculated: 136 mg/dL — ABNORMAL HIGH (ref 0–99)
Triglycerides: 77 mg/dL (ref 0–149)
VLDL Cholesterol Cal: 15 mg/dL (ref 5–40)

## 2018-10-15 LAB — TSH: TSH: 0.576 u[IU]/mL (ref 0.450–4.500)

## 2018-10-26 ENCOUNTER — Encounter: Payer: Self-pay | Admitting: Radiology

## 2018-11-05 ENCOUNTER — Other Ambulatory Visit: Payer: Self-pay | Admitting: Family Medicine

## 2019-01-07 ENCOUNTER — Other Ambulatory Visit: Payer: Self-pay | Admitting: Family Medicine

## 2019-01-07 NOTE — Telephone Encounter (Signed)
Requested Prescriptions  Pending Prescriptions Disp Refills  . omeprazole (PRILOSEC) 20 MG capsule [Pharmacy Med Name: OMEPRAZOLE DR 20 MG CAPSULE] 90 capsule 0    Sig: TOME UNA CAPSULA TODOS LOS DIAS     Gastroenterology: Proton Pump Inhibitors Passed - 01/07/2019  9:16 AM      Passed - Valid encounter within last 12 months    Recent Outpatient Visits          2 months ago Influenza A   [facility], Lilia Argue, MD   10 months ago Essential hypertension, benign   [facility], Lilia Argue, MD   1 year ago Essential hypertension, benign   [facility], Lilia Argue, MD   1 year ago Perimenopause   [facility], Lilia Argue, MD   1 year ago Annual physical exam   [facility], Lilia Argue, MD      Future Appointments            In 3 months Rutherford Guys, MD [facility] [place]

## 2019-01-22 DIAGNOSIS — Z1159 Encounter for screening for other viral diseases: Secondary | ICD-10-CM | POA: Diagnosis not present

## 2019-04-13 ENCOUNTER — Other Ambulatory Visit: Payer: Self-pay

## 2019-04-13 ENCOUNTER — Ambulatory Visit: Payer: BC Managed Care – PPO | Admitting: Family Medicine

## 2019-04-13 ENCOUNTER — Encounter: Payer: Self-pay | Admitting: Family Medicine

## 2019-04-13 VITALS — BP 130/83 | HR 74 | Temp 98.6°F | Ht 60.39 in | Wt 185.0 lb

## 2019-04-13 DIAGNOSIS — K219 Gastro-esophageal reflux disease without esophagitis: Secondary | ICD-10-CM

## 2019-04-13 DIAGNOSIS — I10 Essential (primary) hypertension: Secondary | ICD-10-CM

## 2019-04-13 DIAGNOSIS — R7303 Prediabetes: Secondary | ICD-10-CM

## 2019-04-13 DIAGNOSIS — E0590 Thyrotoxicosis, unspecified without thyrotoxic crisis or storm: Secondary | ICD-10-CM

## 2019-04-13 DIAGNOSIS — E7800 Pure hypercholesterolemia, unspecified: Secondary | ICD-10-CM | POA: Diagnosis not present

## 2019-04-13 DIAGNOSIS — Z23 Encounter for immunization: Secondary | ICD-10-CM

## 2019-04-13 DIAGNOSIS — R1013 Epigastric pain: Secondary | ICD-10-CM

## 2019-04-13 MED ORDER — LISINOPRIL 10 MG PO TABS: 10.0000 mg | ORAL_TABLET | Freq: Every day | ORAL | 3 refills | Status: DC

## 2019-04-13 NOTE — Progress Notes (Signed)
9/10/20204:33 PM  Elizabeth Barr 1970-11-05, 49 y.o., female [id number]  Chief Complaint  Patient presents with  . Hypertension    wants to know how long she hasto stay on the htn pills. Not having having any side effects    HPI:   Patient is a 48 y.o. female with past medical history significant for HTN, HLP, prediabetes, subclinical hyperthyroidism who presents today for routine followup  Last OV March 2020 - telemedicine She has been having about 2 weeks of epigastric pain and vomiting, increased reflux, no blood, no fever or chills Several years ago she had h pylori, this reminds her of that time Denies anorexia, pain does not radiate, denies any changes in her bowels, denies any melena or bright red blood in her stool Denies any sign caffeine intake, nsaids use, etoh use She is a non smoker Has been taking omeprazole daily, not helping  Lab Results  Component Value Date   TSH 0.576 10/14/2018   Lab Results  Component Value Date   HGBA1C 5.8 (H) 10/14/2018   HGBA1C 5.9 (H) 03/04/2018   HGBA1C 5.8 05/13/2017   Lab Results  Component Value Date   LDLCALC 136 (H) 10/14/2018   CREATININE 0.76 10/14/2018    Depression screen PHQ 2/9 04/13/2019 10/14/2018 03/04/2018  Decreased Interest 0 0 0  Down, Depressed, Hopeless 0 0 0  PHQ - 2 Score 0 0 0  Altered sleeping - - -  Tired, decreased energy - - -  Change in appetite - - -  Feeling bad or failure about yourself  - - -  Trouble concentrating - - -  Moving slowly or fidgety/restless - - -  Suicidal thoughts - - -  PHQ-9 Score - - -  Difficult doing work/chores - - -    Fall Risk  04/13/2019 10/14/2018 03/04/2018 08/05/2017 07/02/2017  Falls in the past year? 0 0 No No No  Number falls in past yr: 0 0 - - -  Injury with Fall? 0 0 - - -  Follow up - Falls evaluation completed - - -     No Known Allergies  Prior to Admission medications   Medication Sig Start Date End Date Taking? Authorizing Provider  lisinopril  (PRINIVIL,ZESTRIL) 10 MG tablet TOME UNA TABLETA TODOS LOS DIAS 11/05/18  Yes Rutherford Guys, MD  omeprazole (PRILOSEC) 20 MG capsule TOME UNA CAPSULA TODOS LOS DIAS 01/07/19  Yes Rutherford Guys, MD    Past Medical History:  Diagnosis Date  . Anemia   . Arthritis   . GERD (gastroesophageal reflux disease)   . HTN (hypertension)   . Hyperlipidemia   . Pre-diabetes   . Subclinical hyperthyroidism     Past Surgical History:  Procedure Laterality Date  . CESAREAN SECTION      Social History   Tobacco Use  . Smoking status: Never Smoker  . Smokeless tobacco: Never Used  Substance Use Topics  . Alcohol use: No    Family History  Problem Relation Age of Onset  . Hyperlipidemia Sister     Review of Systems  Constitutional: Negative for chills and fever.  Respiratory: Negative for cough and shortness of breath.   Cardiovascular: Negative for chest pain, palpitations and leg swelling.  Gastrointestinal: Positive for abdominal pain, heartburn, nausea and vomiting. Negative for blood in stool, constipation, diarrhea and melena.     OBJECTIVE:  Today's Vitals   04/13/19 1627  BP: 130/83  Pulse: 74  Temp: 98.6 F (37 C)  TempSrc: Oral  SpO2: 100%  Weight: 185 lb (83.9 kg)  Height: 5' 0.39" (1.534 m)   Body mass index is 35.67 kg/m.   Wt Readings from Last 3 Encounters:  04/13/19 185 lb (83.9 kg)  10/14/18 178 lb 3.2 oz (80.8 kg)  03/04/18 185 lb (83.9 kg)    Physical Exam Vitals signs and nursing note reviewed.  Constitutional:      Appearance: She is well-developed.  HENT:     Head: Normocephalic and atraumatic.     Mouth/Throat:     Pharynx: No oropharyngeal exudate.  Eyes:     General: No scleral icterus.    Conjunctiva/sclera: Conjunctivae normal.     Pupils: Pupils are equal, round, and reactive to light.  Neck:     Musculoskeletal: Neck supple.  Cardiovascular:     Rate and Rhythm: Normal rate and regular rhythm.     Heart sounds: Normal heart  sounds. No murmur. No friction rub. No gallop.   Pulmonary:     Effort: Pulmonary effort is normal.     Breath sounds: Normal breath sounds. No wheezing, rhonchi or rales.  Abdominal:     General: Bowel sounds are normal. There is no distension.     Palpations: There is no hepatomegaly, splenomegaly or mass.     Tenderness: There is abdominal tenderness in the epigastric area and left upper quadrant. There is no guarding or rebound.  Skin:    General: Skin is warm and dry.  Neurological:     Mental Status: She is alert and oriented to person, place, and time.     No results found for this or any previous visit (from the past 24 hour(s)).  No results found.   ASSESSMENT and PLAN  1. Essential hypertension, benign Controlled. Continue current regime.  - Lipid panel - CMP14+EGFR  2. Subclinical hyperthyroidism Asymptomatic. Labs pending - TSH - T4, Free  3. Prediabetes Discussed LFM, labs pending - Hemoglobin A1c  4. Pure hypercholesterolemia Discussed LFM, labs pending  5. Need for prophylactic vaccination and inoculation against influenza - Flu Vaccine QUAD 36+ mos IM  6. Gastroesophageal reflux disease, esophagitis presence not specified D/c PPI/antiacid meds for h pylori test. Discussed LFM. Discussed RTC precautions. - H. pylori breath test; Future  7. Abdominal pain, epigastric - Lipase - Amylase  Other orders - lisinopril (ZESTRIL) 10 MG tablet; Take 1 tablet (10 mg total) by mouth daily.  Return in about 4 weeks (around 05/11/2019) for abd pain.    Rutherford Guys, MD Primary Care at [place], [place] [address] Ph.  [phone] Fax [phone]

## 2019-04-13 NOTE — Patient Instructions (Addendum)
dejar de tomar omeprazole por 2 semanas y luego venir hacerse la prueba de  h pylori    If you have lab work done today you will be contacted with your lab results within the next 2 weeks.  If you have not heard from [place] then please contact us. The fastest way to get your results is to register for My Chart.   IF you received an x-ray today, you will receive an invoice from [facility]. Please contact [facility] at [phone] with questions or concerns regarding your invoice.   IF you received labwork today, you will receive an invoice from [facility]. Please contact [facility] at [phone] with questions or concerns regarding your invoice.   Our billing staff will not be able to assist you with questions regarding bills from these companies.  You will be contacted with the lab results as soon as they are available. The fastest way to get your results is to activate your My Chart account. Instructions are located on the last page of this paperwork. If you have not heard from [place] regarding the results in 2 weeks, please contact this office.     Opciones de alimentos para pacientes adultos con enfermedad de reflujo gastroesofgico Food Choices for Gastroesophageal Reflux Disease, Adult Si tiene enfermedad de reflujo gastroesofgico (ERGE), los alimentos que consume y los hbitos de alimentacin son muy importantes. Elegir los alimentos adecuados puede ayudar a Public house manager las molestias ocasionadas por la [employer]. Considere la posibilidad de trabajar con un especialista en dieta y nutricin (nutricionista) que lo ayude a Adult nurse saludables. Qu pautas generales debo seguir?  Plan de alimentacin  Elija alimentos saludables con bajo contenido de grasa, como frutas, verduras, cereales integrales, productos lcteos descremados, carne magra de vaca, de pescado y de ave.  Haga comidas pequeas con frecuencia en lugar de tres comidas abundantes al da. Coma lentamente, en un  ambiente distendido. Evite agacharse o recostarse hasta despus de 2 o 3horas de haber comido.  Limite los alimentos con alto contenido graso como las carnes grasas o los alimentos fritos.  Limite el consumo de [place], [place] y [place] a menos de 8 cucharaditas al Training and development officer.  Evite lo siguiente: ? Consumir alimentos que le ocasionen sntomas. Pueden ser distintos para cada persona. Lleve un registro de los alimentos para identificar aquellos que le ocasionen sntomas. ? Consumir alcohol. ? Beber grandes cantidades de lquido con las comidas. ? Comer 2 o 3 horas antes de acostarse.  Cocine los alimentos utilizando mtodos que no sean la fritura. Esto puede Ship broker, Marine scientist y hervir. Estilo de vida  Mantenga un peso saludable. Pregunte a su mdico cul es el peso saludable para usted. Si debe perder peso, hable con su mdico para hacerlo de manera segura.  Realice actividad fsica durante, al menos, 30 minutos 5 das por semana o ms, o segn lo indicado por su mdico.  Evite usar ropa ajustada alrededor de la cintura y Management consultant.  No consuma ningn producto que contenga nicotina o tabaco, como cigarrillos y Psychologist, sport and exercise. Si necesita ayuda para dejar de fumar, consulte al mdico.  Duerma con la cabecera de la cama elevada. Use una cua debajo del colchn o bloques debajo del armazn de la cama para Theatre manager la cabecera de la cama elevada. Qu alimentos no se recomiendan? Esta podra no ser [employer]. Hable con el nutricionista sobre las mejores opciones alimenticias para usted. Carbohidratos Pasteles o panes sin levadura con grasa agregada. Tostadas francesas. Verduras Verduras fritas en  abundante aceite. Papas fritas. Cualquier verdura que est preparada con grasa agregada. Cualquier verdura que le ocasione sntomas. Para algunas personas, estas pueden incluir tomates y productos con tomate, [place], cebollas y [place], y rbanos picantes. Frutas Cualquier  fruta que est preparada con grasa agregada. Cualquier fruta que le ocasione sntomas. Para algunas personas, estas pueden incluir, las frutas ctricas como naranja, pomelo, pia y limn. Carnes y otros alimentos ricos en protenas Carnes de alto contenido graso como carne grasa de vaca o cerdo, salchichas, costillas, [place], salchicha, salame y tocino. Carnes o protenas fritas, lo que incluye pescado frito y pollo frito. Nueces y [place] de frutos secos. Lcteos [place]he entera y [place] con chocolate. [employer]. Crema. Helados. Queso crema. Batidos con [employer]. Bebidas Caf y t negro, con o sin cafena. Bebidas carbonatadas. Refrescos. Bebidas energizantes. Jugo de fruta hecho con frutas cidas (como naranja o pomelo). Jugo de tomate. Bebidas alcohlicas. Grasas y [employer]. Margarina. Lardo. Mantequilla clarificada. Dulces y postres Chocolate y cacao. Rosquillas. Condimentos y otros alimentos [place]. Menta y mentol. Cualquier condimento, hierbas o aderezos que le ocasionen sntomas. Para algunas personas, esto puede incluir curry, salsa picante o aderezos para ensalada a base de vinagre. Resumen  Si tiene enfermedad de reflujo gastroesofgico (ERGE), las elecciones de alimentos y [place] de vida son muy importantes para ayudar a Public house manager las molestias de la [place].  Haga comidas pequeas con frecuencia en lugar de tres comidas abundantes al da. Coma lentamente, en un ambiente distendido. Evite agacharse o recostarse hasta despus de 2 o 3horas de haber comido.  Limite los alimentos con alto contenido graso como la carne grasa o los alimentos fritos. Esta informacin no tiene Marine scientist el consejo del mdico. Asegrese de hacerle al mdico cualquier pregunta que tenga. Document Released: 04/29/2005 Document Revised: 11/09/2016 Document Reviewed: 05/24/2013 Elsevier Patient Education  2020 [employer].

## 2019-04-14 LAB — CMP14+EGFR
ALT: 11 IU/L (ref 0–32)
AST: 15 IU/L (ref 0–40)
Albumin/Globulin Ratio: 1.3 (ref 1.2–2.2)
Albumin: 4.4 g/dL (ref 3.8–4.8)
Alkaline Phosphatase: 93 IU/L (ref 39–117)
BUN/Creatinine Ratio: 22 (ref 9–23)
BUN: 14 mg/dL (ref 6–24)
Bilirubin Total: 0.2 mg/dL (ref 0.0–1.2)
CO2: 26 mmol/L (ref 20–29)
Calcium: 10.1 mg/dL (ref 8.7–10.2)
Chloride: 99 mmol/L (ref 96–106)
Creatinine, Ser: 0.64 mg/dL (ref 0.57–1.00)
GFR calc Af Amer: 125 mL/min/{1.73_m2} (ref >59)
GFR calc non Af Amer: 108 mL/min/{1.73_m2} (ref >59)
Globulin, Total: 3.4 g/dL (ref 1.5–4.5)
Glucose: 91 mg/dL (ref 65–99)
Potassium: 4.6 mmol/L (ref 3.5–5.2)
Sodium: 140 mmol/L (ref 134–144)
Total Protein: 7.8 g/dL (ref 6.0–8.5)

## 2019-04-14 LAB — LIPASE: Lipase: 24 U/L (ref 14–72)

## 2019-04-14 LAB — LIPID PANEL
Chol/HDL Ratio: 4.5 ratio — ABNORMAL HIGH (ref 0.0–4.4)
Cholesterol, Total: 229 mg/dL — ABNORMAL HIGH (ref 100–199)
HDL: 51 mg/dL (ref >39)
LDL Chol Calc (NIH): 161 mg/dL — ABNORMAL HIGH (ref 0–99)
Triglycerides: 96 mg/dL (ref 0–149)
VLDL Cholesterol Cal: 17 mg/dL (ref 5–40)

## 2019-04-14 LAB — T4, FREE: Free T4: 1.04 ng/dL (ref 0.82–1.77)

## 2019-04-14 LAB — HEMOGLOBIN A1C
Est. average glucose Bld gHb Est-mCnc: 123 mg/dL
Hgb A1c MFr Bld: 5.9 % — ABNORMAL HIGH (ref 4.8–5.6)

## 2019-04-14 LAB — TSH: TSH: 0.978 u[IU]/mL (ref 0.450–4.500)

## 2019-04-14 LAB — AMYLASE: Amylase: 42 U/L (ref 31–110)

## 2019-04-29 ENCOUNTER — Emergency Department (HOSPITAL_COMMUNITY)
Admission: EM | Admit: 2019-04-29 | Discharge: 2019-04-29 | Disposition: A | Discharge status: Home / Self Care | Payer: BC Managed Care – PPO | Attending: Emergency Medicine | Admitting: Emergency Medicine

## 2019-04-29 ENCOUNTER — Encounter (HOSPITAL_COMMUNITY): Payer: Self-pay | Admitting: Emergency Medicine

## 2019-04-29 ENCOUNTER — Emergency Department (HOSPITAL_COMMUNITY): Payer: BC Managed Care – PPO

## 2019-04-29 ENCOUNTER — Other Ambulatory Visit: Payer: Self-pay

## 2019-04-29 DIAGNOSIS — Z20822 Contact with and (suspected) exposure to covid-19: Secondary | ICD-10-CM

## 2019-04-29 DIAGNOSIS — R509 Fever, unspecified: Secondary | ICD-10-CM | POA: Diagnosis not present

## 2019-04-29 DIAGNOSIS — E039 Hypothyroidism, unspecified: Secondary | ICD-10-CM | POA: Insufficient documentation

## 2019-04-29 DIAGNOSIS — Z79899 Other long term (current) drug therapy: Secondary | ICD-10-CM | POA: Insufficient documentation

## 2019-04-29 DIAGNOSIS — U071 COVID-19: Secondary | ICD-10-CM | POA: Insufficient documentation

## 2019-04-29 DIAGNOSIS — I10 Essential (primary) hypertension: Secondary | ICD-10-CM | POA: Diagnosis not present

## 2019-04-29 DIAGNOSIS — J189 Pneumonia, unspecified organism: Secondary | ICD-10-CM | POA: Diagnosis not present

## 2019-04-29 DIAGNOSIS — R05 Cough: Secondary | ICD-10-CM | POA: Diagnosis not present

## 2019-04-29 DIAGNOSIS — Z20828 Contact with and (suspected) exposure to other viral communicable diseases: Secondary | ICD-10-CM

## 2019-04-29 MED ORDER — AZITHROMYCIN 250 MG PO TABS: ORAL_TABLET | ORAL | 0 refills | Status: DC

## 2019-04-29 MED ORDER — BENZONATATE 200 MG PO CAPS: 200.0000 mg | ORAL_CAPSULE | Freq: Three times a day (TID) | ORAL | 0 refills | Status: DC | PRN

## 2019-04-29 NOTE — Discharge Instructions (Addendum)
Your x-ray today shows a likely pneumonia.  This may be related to COVID 19.  You have been tested today for COVID, your results should be back in 1 to 2 days.  You will be contacted if your results are positive.  You may also review your results on MyChart.  You will need to isolate at home and try to avoid contact with others.  Family members will need to wear a mask.  Follow-up with your primary doctor for recheck.  Return to the ER if your symptoms worsen or you develop chest pain or shortness of breath.

## 2019-04-29 NOTE — ED Triage Notes (Signed)
Patient c/o fever, body aches, headache, cough, and vomiting x1 week. Per patient has been exposed to covid- positive patients.

## 2019-04-29 NOTE — ED Provider Notes (Signed)
[facility] EMERGENCY DEPARTMENT Provider Note   CSN: [id number] Arrival date & time: 04/29/19  1030     History   Chief Complaint Chief Complaint  Patient presents with  . Fever    HPI Elizabeth Barr is a 48 y.o. female.     HPI   Elizabeth Barr is a 48 y.o. female who presents to the Emergency Department complaining of generalized body aches, fever, chills, frontal headache and cough.  Symptoms have been present for 1 week.  She endorses a cough that is occasionally productive of clear to yellow sputum and posttussive emesis.  Fever is subjective and she states that she "feels warm."  She states that she has had some recent exposures to someone who tested positive for COVID.  She describes a headache that is dull and radiating across her forehead.  She has not taken any medications for symptomatic treatment.  She denies chest pain, shortness of breath, sore throat, dysuria, and a loss of taste or smell.    Past Medical History:  Diagnosis Date  . Anemia   . Arthritis   . GERD (gastroesophageal reflux disease)   . HTN (hypertension)   . Hyperlipidemia   . Pre-diabetes   . Subclinical hyperthyroidism     Patient Active Problem List   Diagnosis Date Noted  . Subclinical hyperthyroidism 07/02/2017  . Prediabetes 07/02/2017  . Perimenopause 07/02/2017  . Essential hypertension, benign 07/02/2017    Past Surgical History:  Procedure Laterality Date  . CESAREAN SECTION       OB History    Gravida  1   Para      Term      Preterm      AB      Living        SAB      TAB      Ectopic      Multiple      Live Births               Home Medications    Prior to Admission medications   Medication Sig Start Date End Date Taking? Authorizing Provider  azithromycin (ZITHROMAX) 250 MG tablet Take first 2 tablets together, then 1 every day until finished. 04/29/19   Lajeana Strough, PA-C  benzonatate (TESSALON) 200 MG capsule Take 1 capsule (200  mg total) by mouth 3 (three) times daily as needed for cough. Swallow whole, do not chew 04/29/19   Harjot Dibello, PA-C  lisinopril (ZESTRIL) 10 MG tablet Take 1 tablet (10 mg total) by mouth daily. 04/13/19   Rutherford Guys, MD    Family History Family History  Problem Relation Age of Onset  . Hyperlipidemia Sister     Social History Social History   Tobacco Use  . Smoking status: Never Smoker  . Smokeless tobacco: Never Used  Substance Use Topics  . Alcohol use: No  . Drug use: No     Allergies   Patient has no known allergies.   Review of Systems Review of Systems  Constitutional: Positive for chills and fever. Negative for appetite change.  HENT: Negative for congestion, ear pain, sore throat and trouble swallowing.   Respiratory: Positive for cough. Negative for chest tightness, shortness of breath and wheezing.   Cardiovascular: Negative for chest pain.  Gastrointestinal: Positive for vomiting. Negative for abdominal distention, abdominal pain and nausea.  Genitourinary: Negative for dysuria and flank pain.  Musculoskeletal: Positive for myalgias (Generalized body aches).  Skin: Negative for  rash and wound.  Neurological: Positive for headaches. Negative for dizziness, syncope and weakness.     Physical Exam Updated Vital Signs BP 139/84 (BP Location: Right Arm)   Pulse 77   Temp 99 F (37.2 C) (Oral)   Resp 20   Ht 5' (1.524 m)   Wt 83.9 kg   SpO2 95%   BMI 36.13 kg/m   Physical Exam Vitals signs and nursing note reviewed.  Constitutional:      Appearance: Normal appearance. She is not ill-appearing or toxic-appearing.  HENT:     Head: Atraumatic.     Right Ear: Tympanic membrane and ear canal normal.     Left Ear: Tympanic membrane and ear canal normal.     Nose: Nose normal.     Mouth/Throat:     Mouth: Mucous membranes are moist.     Pharynx: Oropharynx is clear. No oropharyngeal exudate or posterior oropharyngeal erythema.  Neck:      Musculoskeletal: Normal range of motion.  Cardiovascular:     Rate and Rhythm: Normal rate and regular rhythm.     Pulses: Normal pulses.  Pulmonary:     Effort: Pulmonary effort is normal. No respiratory distress.     Breath sounds: Normal breath sounds.  Abdominal:     General: There is no distension.     Palpations: Abdomen is soft.     Tenderness: There is no abdominal tenderness. There is no guarding.  Musculoskeletal: Normal range of motion.     Right lower leg: No edema.     Left lower leg: No edema.  Skin:    General: Skin is warm.     Capillary Refill: Capillary refill takes less than 2 seconds.     Findings: No erythema or rash.  Neurological:     General: No focal deficit present.     Mental Status: She is alert.     Sensory: No sensory deficit.     Motor: No weakness.      ED Treatments / Results  Labs (all labs ordered are listed, but only abnormal results are displayed) Labs Reviewed  SARS CORONAVIRUS 2 (TAT 6-24 HRS)    EKG None  Radiology Dg Chest Portable 1 View  Result Date: 04/29/2019 CLINICAL DATA:  Cough, fever EXAM: PORTABLE CHEST 1 VIEW COMPARISON:  None. FINDINGS: The heart size and mediastinal contours are within normal limits. There is heterogeneous, somewhat nodular airspace opacity present bilaterally, particularly nodular appearing in the right upper lobe. The visualized skeletal structures are unremarkable. IMPRESSION: There is heterogeneous, somewhat nodular airspace opacity present bilaterally, particularly nodular appearing in the right upper lobe. Findings are generally consistent with multifocal infection. Recommend follow-up radiographs at 6-8 weeks to exclude underlying malignancy. Electronically Signed   By: Eddie Candle M.D.   On: 04/29/2019 12:15    Procedures Procedures (including critical care time)  Medications Ordered in ED Medications - No data to display   Initial Impression / Assessment and Plan / ED Course  I have  reviewed the triage vital signs and the nursing notes.  Pertinent labs & imaging results that were available during my care of the patient were reviewed by me and considered in my medical decision making (see chart for details).      Patient well-appearing.  Nontoxic.  Vital signs reviewed.  Patient with multiple symptoms and known COVID exposure.  Chest x-ray today shows likely multifocal pneumonia.  COVID testing is pending.  Will treat with macrolide based on x-ray findings until  COVID results are back.  Patient understands that she will isolate at home at least until her results are back.  She agrees to Tylenol for fever and close follow-up.  Strict return precautions were discussed.    Final Clinical Impressions(s) / ED Diagnoses   Final diagnoses:  Community acquired pneumonia of right lung, unspecified part of lung  Close Exposure to Covid-19 Virus    ED Discharge Orders         Ordered    azithromycin (ZITHROMAX) 250 MG tablet     04/29/19 1256    benzonatate (TESSALON) 200 MG capsule  3 times daily PRN     04/29/19 1256           Aleicia Kenagy, [place], PA-C 04/29/19 1514    Fredia Sorrow, MD 05/01/19 1226

## 2019-04-30 LAB — SARS CORONAVIRUS 2 (TAT 6-24 HRS): SARS Coronavirus 2: POSITIVE — AB

## 2019-05-15 ENCOUNTER — Ambulatory Visit: Payer: BC Managed Care – PPO | Admitting: Family Medicine

## 2019-05-15 ENCOUNTER — Encounter: Payer: Self-pay | Admitting: Family Medicine

## 2019-05-15 ENCOUNTER — Ambulatory Visit (INDEPENDENT_AMBULATORY_CARE_PROVIDER_SITE_OTHER): Payer: BC Managed Care – PPO

## 2019-05-15 ENCOUNTER — Other Ambulatory Visit: Payer: Self-pay

## 2019-05-15 VITALS — BP 107/63 | HR 69 | Temp 98.7°F | Ht 60.0 in | Wt 184.0 lb

## 2019-05-15 DIAGNOSIS — Z8701 Personal history of pneumonia (recurrent): Secondary | ICD-10-CM

## 2019-05-15 DIAGNOSIS — K219 Gastro-esophageal reflux disease without esophagitis: Secondary | ICD-10-CM

## 2019-05-15 DIAGNOSIS — U071 COVID-19: Secondary | ICD-10-CM

## 2019-05-15 DIAGNOSIS — R05 Cough: Secondary | ICD-10-CM | POA: Diagnosis not present

## 2019-05-15 LAB — POCT CBC
Granulocyte percent: 63.5 %G (ref 37–80)
HCT, POC: 33.2 % (ref 29–41)
Hemoglobin: 10.8 g/dL — AB (ref 11–14.6)
Lymph, poc: 3.9 — AB (ref 0.6–3.4)
MCH, POC: 23.1 pg — AB (ref 27–31.2)
MCHC: 32.5 g/dL (ref 31.8–35.4)
MCV: 71.2 fL — AB (ref 76–111)
MID (cbc): 0.7 (ref 0–0.9)
MPV: 7.5 fL (ref 0–99.8)
POC Granulocyte: 8 — AB (ref 2–6.9)
POC LYMPH PERCENT: 30.9 %L (ref 10–50)
POC MID %: 5.6 %M (ref 0–12)
Platelet Count, POC: 453 10*3/uL — AB (ref 142–424)
RBC: 4.66 M/uL (ref 4.04–5.48)
RDW, POC: 17 %
WBC: 12.6 10*3/uL — AB (ref 4.6–10.2)

## 2019-05-15 MED ORDER — PANTOPRAZOLE SODIUM 20 MG PO TBEC: 20.0000 mg | DELAYED_RELEASE_TABLET | Freq: Two times a day (BID) | ORAL | 0 refills | Status: DC

## 2019-05-15 MED ORDER — BENZONATATE 100 MG PO CAPS: 100.0000 mg | ORAL_CAPSULE | Freq: Three times a day (TID) | ORAL | 0 refills | Status: DC | PRN

## 2019-05-15 NOTE — Patient Instructions (Signed)
° ° ° °  If you have lab work done today you will be contacted with your lab results within the next 2 weeks.  If you have not heard from us then please contact us. The fastest way to get your results is to register for My Chart. ° ° °IF you received an x-ray today, you will receive an invoice from [facility]. Please contact [facility] at [phone] with questions or concerns regarding your invoice.  ° °IF you received labwork today, you will receive an invoice from [facility]. Please contact [facility] at [phone] with questions or concerns regarding your invoice.  ° °Our billing staff will not be able to assist you with questions regarding bills from these companies. ° °You will be contacted with the lab results as soon as they are available. The fastest way to get your results is to activate your My Chart account. Instructions are located on the last page of this paperwork. If you have not heard from us regarding the results in 2 weeks, please contact this office. °  ° ° ° °

## 2019-05-15 NOTE — Progress Notes (Signed)
10/12/20204:36 PM  Elizabeth Barr [birth year], 48 y.o., female [id number]  Chief Complaint  Patient presents with  . Follow-up    abdominal pain for 1 month,taking no meds    HPI:   Patient is a 48 y.o. 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  Wt Readings from Last 3 Encounters:  05/15/19 184 lb (83.5 kg)  04/29/19 185 lb (83.9 kg)  04/13/19 185 lb (83.9 kg)    Depression screen [facility] 2/9 05/15/2019 04/13/2019 10/14/2018  Decreased Interest 0 0 0  Down, Depressed, Hopeless 0 0 0  PHQ - 2 Score 0 0 0  Altered sleeping - - -  Tired, decreased energy - - -  Change in appetite - - -  Feeling bad or failure about yourself  - - -  Trouble concentrating - - -  Moving slowly or fidgety/restless - - -  Suicidal thoughts - - -  PHQ-9 Score - - -  Difficult doing work/chores - - -    Fall Risk  05/15/2019 04/13/2019 10/14/2018 03/04/2018 08/05/2017  Falls in the past year? 0 0 0 No No  Number falls in past yr: 0 0 0 - -  Injury with Fall? 0 0 0 - -  Follow up - - Falls evaluation completed - -     No Known Allergies  Prior to Admission medications   Medication Sig Start Date End Date Taking? Authorizing Provider  lisinopril (ZESTRIL) 10 MG tablet Take 1 tablet (10 mg total) by mouth daily. 04/13/19  Yes Rutherford Guys, MD    Past Medical History:  Diagnosis Date  . Anemia   . Arthritis   . GERD (gastroesophageal reflux disease)    . HTN (hypertension)   . Hyperlipidemia   . Pre-diabetes   . Subclinical hyperthyroidism     Past Surgical History:  Procedure Laterality Date  . CESAREAN SECTION      Social History   Tobacco Use  . Smoking status: Never Smoker  . Smokeless tobacco: Never Used  Substance Use Topics  . Alcohol use: No    Family History  Problem Relation Age of Onset  . Hyperlipidemia Sister     Review of Systems  Constitutional: Positive for malaise/fatigue. Negative for chills, diaphoresis and fever.  Respiratory: Positive for cough. Negative for hemoptysis, sputum production, shortness of breath and wheezing.   Cardiovascular: Negative for chest pain, palpitations and leg swelling.  Gastrointestinal: Positive for abdominal pain, heartburn and nausea. Negative for blood in stool, constipation, diarrhea, melena and vomiting.     OBJECTIVE:  Today's Vitals   05/15/19 1629  BP: 107/63  Pulse: 69  Temp: 98.7 F (37.1 C)  SpO2: 98%  Weight: 184 lb (83.5 kg)  Height: 5' (1.524 m)   Body mass index is 35.94 kg/m.   Physical Exam Vitals signs and nursing note reviewed.  Constitutional:      Appearance: She is well-developed.  HENT:     Head:  Normocephalic and atraumatic.     Right Ear: Hearing, tympanic membrane, ear canal and external ear normal.     Left Ear: Hearing, tympanic membrane, ear canal and external ear normal.     Mouth/Throat:     Pharynx: No oropharyngeal exudate.  Eyes:     General: No scleral icterus.    Conjunctiva/sclera: Conjunctivae normal.     Pupils: Pupils are equal, round, and reactive to light.  Neck:     Musculoskeletal: Neck supple.  Cardiovascular:     Rate and Rhythm: Normal rate and regular rhythm.     Heart sounds: Normal heart sounds. No murmur. No friction rub. No gallop.   Pulmonary:     Effort: Pulmonary effort is normal.     Breath sounds: Normal breath sounds. No wheezing or rales.  Lymphadenopathy:     Cervical: No cervical  adenopathy.  Skin:    General: Skin is warm and dry.  Neurological:     Mental Status: She is alert and oriented to person, place, and time.     Results for orders placed or performed in visit on 05/15/19 (from the past 24 hour(s))  POCT CBC     Status: Abnormal   Collection Time: 05/15/19  5:12 PM  Result Value Ref Range   WBC 12.6 (A) 4.6 - 10.2 K/uL   Lymph, poc 3.9 (A) 0.6 - 3.4   POC LYMPH PERCENT 30.9 10 - 50 %L   MID (cbc) 0.7 0 - 0.9   POC MID % 5.6 0 - 12 %M   POC Granulocyte 8.0 (A) 2 - 6.9   Granulocyte percent 63.5 37 - 80 %G   RBC 4.66 4.04 - 5.48 M/uL   Hemoglobin 10.8 (A) 11 - 14.6 g/dL   HCT, POC 33.2 29 - 41 %   MCV 71.2 (A) 76 - 111 fL   MCH, POC 23.1 (A) 27 - 31.2 pg   MCHC 32.5 31.8 - 35.4 g/dL   RDW, POC 17.0 %   Platelet Count, POC 453 (A) 142 - 424 K/uL   MPV 7.5 0 - 99.8 fL    Dg Chest 2 View  Result Date: 05/15/2019 CLINICAL DATA:  Cough and elevated white blood count. Recent community acquired pneumonia. EXAM: CHEST - 2 VIEW COMPARISON:  Chest x-ray dated 04/29/2019 and chest CT dated 11/17/2007 FINDINGS: The heart size and pulmonary vascularity are normal. No effusions. There has been complete clearing of the right lung. There are tiny residual nodular densities peripherally in the left midzone and left base which most likely represent granulomas. The nodule at the left lung base laterally is demonstrated to be a granuloma on the previous abdominal CT scan dated 11/17/2007. IMPRESSION: Clearing of the bilateral pulmonary infiltrates. Granulomas in the left lung. Electronically Signed   By: Lorriane Shire M.D.   On: 05/15/2019 17:25     ASSESSMENT and PLAN  1. Gastroesophageal reflux disease Restart PPI, trial of pantoprazole, reviewed LFM - H. pylori breath test  2. History of community acquired pneumonia Clinically improving, reassured cough and fatigue can linger, cont with supportive measures - POCT CBC - DG Chest 2 View; Future  3.  COVID-19 virus infection Has completed 14 day recommended quarantine.   Other orders - benzonatate (TESSALON) 100 MG capsule; Take 1-2 capsules (100-200 mg total) by mouth 3 (three) times daily as needed for cough. - pantoprazole (PROTONIX) 20 MG tablet; Take 1 tablet (20 mg total) by mouth 2 (two) times daily before a meal.  Return in about 2 weeks (around 05/29/2019).    Rutherford Guys, MD Primary Care at [place], [place] [address] Ph.  [phone] Fax [phone]

## 2019-05-16 LAB — H. PYLORI BREATH COLLECTION

## 2019-05-16 LAB — H. PYLORI BREATH TEST: H pylori Breath Test: NEGATIVE

## 2019-05-22 DIAGNOSIS — I8311 Varicose veins of right lower extremity with inflammation: Secondary | ICD-10-CM | POA: Diagnosis not present

## 2019-05-22 DIAGNOSIS — I8312 Varicose veins of left lower extremity with inflammation: Secondary | ICD-10-CM | POA: Diagnosis not present

## 2019-05-22 DIAGNOSIS — I83813 Varicose veins of bilateral lower extremities with pain: Secondary | ICD-10-CM | POA: Diagnosis not present

## 2019-05-29 ENCOUNTER — Ambulatory Visit (INDEPENDENT_AMBULATORY_CARE_PROVIDER_SITE_OTHER): Payer: BC Managed Care – PPO | Admitting: Family Medicine

## 2019-05-29 ENCOUNTER — Encounter: Payer: Self-pay | Admitting: Family Medicine

## 2019-05-29 ENCOUNTER — Other Ambulatory Visit: Payer: Self-pay

## 2019-05-29 VITALS — BP 129/83 | HR 68 | Temp 98.3°F | Ht 60.0 in | Wt 185.6 lb

## 2019-05-29 DIAGNOSIS — Z8619 Personal history of other infectious and parasitic diseases: Secondary | ICD-10-CM

## 2019-05-29 DIAGNOSIS — Z8616 Personal history of COVID-19: Secondary | ICD-10-CM

## 2019-05-29 DIAGNOSIS — K219 Gastro-esophageal reflux disease without esophagitis: Secondary | ICD-10-CM | POA: Diagnosis not present

## 2019-05-29 DIAGNOSIS — Z8701 Personal history of pneumonia (recurrent): Secondary | ICD-10-CM

## 2019-05-29 MED ORDER — SUCRALFATE 1 GM/10ML PO SUSP: 1.0000 g | Freq: Three times a day (TID) | ORAL | 0 refills | Status: DC

## 2019-05-29 NOTE — Patient Instructions (Signed)
° ° ° °  If you have lab work done today you will be contacted with your lab results within the next 2 weeks.  If you have not heard from us then please contact us. The fastest way to get your results is to register for My Chart. ° ° °IF you received an x-ray today, you will receive an invoice from [facility]. Please contact [facility] at [phone] with questions or concerns regarding your invoice.  ° °IF you received labwork today, you will receive an invoice from [facility]. Please contact [facility] at [phone] with questions or concerns regarding your invoice.  ° °Our billing staff will not be able to assist you with questions regarding bills from these companies. ° °You will be contacted with the lab results as soon as they are available. The fastest way to get your results is to activate your My Chart account. Instructions are located on the last page of this paperwork. If you have not heard from us regarding the results in 2 weeks, please contact this office. °  ° ° ° °

## 2019-05-29 NOTE — Progress Notes (Signed)
10/26/20203:25 PM  Elizabeth Barr 04/13/1971, 48 y.o., female [id number]  Chief Complaint  Patient presents with  . Gastroesophageal Reflux    2 weeks f.u     HPI:   Patient is a 48 y.o. female with past medical history significant for HTN, HLP, prediabetes who presents today for followup on GERD  Last OV 2 weeks ago h pylori - negative, started pantoprazole BID, LFM, reflux better but not controlled Feels as is she needed to belch but unable to do so. Has never seen GI Normal abd/pelvis CT in 2009  Recovering from covid/multifocal PNA - doing much better, still has occ upper back pain, but energy levels, cough much improved Ready to go back to work Needs FMLA    Depression screen [facility] 2/9 05/29/2019 05/15/2019 04/13/2019  Decreased Interest 0 0 0  Down, Depressed, Hopeless 0 0 0  PHQ - 2 Score 0 0 0  Altered sleeping - - -  Tired, decreased energy - - -  Change in appetite - - -  Feeling bad or failure about yourself  - - -  Trouble concentrating - - -  Moving slowly or fidgety/restless - - -  Suicidal thoughts - - -  PHQ-9 Score - - -  Difficult doing work/chores - - -    Fall Risk  05/29/2019 05/15/2019 04/13/2019 10/14/2018 03/04/2018  Falls in the past year? 0 0 0 0 No  Number falls in past yr: 0 0 0 0 -  Injury with Fall? 0 0 0 0 -  Follow up Falls evaluation completed - - Falls evaluation completed -     No Known Allergies  Prior to Admission medications   Medication Sig Start Date End Date Taking? Authorizing Provider  lisinopril (ZESTRIL) 10 MG tablet Take 1 tablet (10 mg total) by mouth daily. 04/13/19  Yes Rutherford Guys, MD  pantoprazole (PROTONIX) 20 MG tablet Take 1 tablet (20 mg total) by mouth 2 (two) times daily before a meal. 05/15/19  Yes Rutherford Guys, MD  benzonatate (TESSALON) 100 MG capsule Take 1-2 capsules (100-200 mg total) by mouth 3 (three) times daily as needed for cough. Patient not taking: Reported on 05/29/2019 05/15/19    Rutherford Guys, MD    Past Medical History:  Diagnosis Date  . Anemia   . Arthritis   . GERD (gastroesophageal reflux disease)   . HTN (hypertension)   . Hyperlipidemia   . Pre-diabetes   . Subclinical hyperthyroidism     Past Surgical History:  Procedure Laterality Date  . CESAREAN SECTION      Social History   Tobacco Use  . Smoking status: Never Smoker  . Smokeless tobacco: Never Used  Substance Use Topics  . Alcohol use: No    Family History  Problem Relation Age of Onset  . Hyperlipidemia Sister     Review of Systems  Constitutional: Negative for chills and fever.  Respiratory: Negative for cough and shortness of breath.   Cardiovascular: Negative for chest pain, palpitations and leg swelling.  Gastrointestinal: Positive for heartburn and nausea. Negative for abdominal pain, blood in stool, melena and vomiting.     OBJECTIVE:  Today's Vitals   05/29/19 1502  BP: 129/83  Pulse: 68  Temp: 98.3 F (36.8 C)  TempSrc: Oral  SpO2: 98%  Weight: 185 lb 9.6 oz (84.2 kg)  Height: 5' (1.524 m)   Body mass index is 36.25 kg/m.   Physical Exam Vitals signs and nursing note reviewed.  Constitutional:      Appearance: She is well-developed.  HENT:     Head: Normocephalic and atraumatic.     Mouth/Throat:     Pharynx: No oropharyngeal exudate.  Eyes:     General: No scleral icterus.    Conjunctiva/sclera: Conjunctivae normal.     Pupils: Pupils are equal, round, and reactive to light.  Neck:     Musculoskeletal: Neck supple.  Cardiovascular:     Rate and Rhythm: Normal rate and regular rhythm.     Heart sounds: Normal heart sounds. No murmur. No friction rub. No gallop.   Pulmonary:     Effort: Pulmonary effort is normal.     Breath sounds: Normal breath sounds. No wheezing or rales.  Skin:    General: Skin is warm and dry.  Neurological:     Mental Status: She is alert and oriented to person, place, and time.     No results found for this  or any previous visit (from the past 24 hour(s)).  No results found.   ASSESSMENT and PLAN  1. Gastroesophageal reflux disease without esophagitis Improved. Continue with PPI, if not controlled over next 2 weeks, then start carafate and refer to GI.  2. History of 2019 novel coronavirus disease (COVID-19) 3. History of community acquired pneumonia Recovered. FMLA forms completed. Able to return to work without restrictions.  Other orders - sucralfate (CARAFATE) 1 GM/10ML suspension; Take 10 mLs (1 g total) by mouth 4 (four) times daily -  with meals and at bedtime.  Return in about 6 months (around 11/27/2019).    Rutherford Guys, MD Primary Care at [place], [place] [address] Ph.  [phone] Fax [phone]

## 2019-06-05 NOTE — Addendum Note (Signed)
Encounter addended by: Kem Parkinson, PA-C on: 06/05/2019 9:26 AM  Actions taken: Letter saved

## 2019-06-07 ENCOUNTER — Other Ambulatory Visit: Payer: Self-pay | Admitting: Family Medicine

## 2019-06-26 DIAGNOSIS — I83813 Varicose veins of bilateral lower extremities with pain: Secondary | ICD-10-CM | POA: Diagnosis not present

## 2019-06-26 DIAGNOSIS — I8312 Varicose veins of left lower extremity with inflammation: Secondary | ICD-10-CM | POA: Diagnosis not present

## 2019-06-26 DIAGNOSIS — I8311 Varicose veins of right lower extremity with inflammation: Secondary | ICD-10-CM | POA: Diagnosis not present

## 2019-06-27 ENCOUNTER — Ambulatory Visit: Payer: BC Managed Care – PPO | Admitting: Family Medicine

## 2019-06-27 ENCOUNTER — Telehealth: Payer: Self-pay | Admitting: Family Medicine

## 2019-06-27 NOTE — Telephone Encounter (Signed)
06/27/2019 - PATIENT HAS AN APPOINTMENT WITH DR. Benay Spice ON Tuesday (06/27/2019) TO HAVE HER PAP SMEAR. DR. Benay Spice CAME UP TO ME AND ASKED THAT I CALL Elizabeth Barr AND TELL HER SHE IS NOT DUE FOR HER PAP SMEAR UNTIL THE YEAR 2022. I TRIED TO CALL HER BUT HAD TO LEAVE A VOICE MAIL. I DID NOT PUT ON THE VOICE MAIL WHY SHE DOES NOT NEED TO COME IN BECAUSE IT IS PERSONAL. I TOLD HER TO ASK FOR ME, BUT IF SHE CALLS WHILE I AM AT [place] - THIS IS THE MESSAGE DR. IRMA WANTED ME TO TELL HER. [place]

## 2019-07-11 ENCOUNTER — Other Ambulatory Visit: Payer: Self-pay | Admitting: Family Medicine

## 2019-07-11 MED ORDER — PANTOPRAZOLE SODIUM 20 MG PO TBEC: DELAYED_RELEASE_TABLET | ORAL | 0 refills | Status: DC

## 2019-07-11 NOTE — Telephone Encounter (Signed)
Copied from [place] [id number]. Topic: Quick Communication - Rx Refill/Question >> Jul 11, 2019  1:46 PM Leward Quan A wrote: Medication: pantoprazole (PROTONIX) 20 MG tablet   Has the patient contacted their pharmacy? Yes.   (Agent: If no, request that the patient contact the pharmacy for the refill.) (Agent: If yes, when and what did the pharmacy advise?)  Preferred Pharmacy (with phone number or street name): [id number] - [place], [place] - [address] [phone] (Phone) [phone] (Fax)    Agent: Please be advised that RX refills may take up to 3 business days. We ask that you follow-up with your pharmacy.

## 2019-07-17 DIAGNOSIS — I8311 Varicose veins of right lower extremity with inflammation: Secondary | ICD-10-CM | POA: Diagnosis not present

## 2019-07-17 DIAGNOSIS — I83813 Varicose veins of bilateral lower extremities with pain: Secondary | ICD-10-CM | POA: Diagnosis not present

## 2019-07-17 DIAGNOSIS — I8312 Varicose veins of left lower extremity with inflammation: Secondary | ICD-10-CM | POA: Diagnosis not present

## 2019-08-09 ENCOUNTER — Other Ambulatory Visit: Payer: Self-pay | Admitting: Family Medicine

## 2019-08-28 ENCOUNTER — Other Ambulatory Visit: Payer: Self-pay | Admitting: Family Medicine

## 2019-08-28 NOTE — Telephone Encounter (Signed)
Requested medication (s) are due for refill today: yes  Requested medication (s) are on the active medication list: yes   Future visit scheduled: yes  Notes to clinic:  REQUEST FOR [place]   Requested Prescriptions  Pending Prescriptions Disp Refills   pantoprazole (PROTONIX) 20 MG tablet [Pharmacy Med Name: PANTOPRAZOLE SOD DR 20 MG TAB] 180 tablet 1    Sig: TOME UNA TABLETA DOS VECES AL DIA WITH A MEAL      Gastroenterology: Proton Pump Inhibitors Passed - 08/28/2019 11:08 AM      Passed - Valid encounter within last 12 months    Recent Outpatient Visits           3 months ago Gastroesophageal reflux disease without esophagitis   [facility], Lilia Argue, MD   3 months ago Gastroesophageal reflux disease without esophagitis   [facility], Lilia Argue, MD   4 months ago Essential hypertension, benign   [facility], Lilia Argue, MD   10 months ago Influenza A   [facility], Lilia Argue, MD   1 year ago Essential hypertension, benign   [facility], Lilia Argue, MD       Future Appointments             In 3 months Rutherford Guys, MD Primary Care at [place], [facility]

## 2019-10-02 ENCOUNTER — Other Ambulatory Visit: Payer: Self-pay | Admitting: Family Medicine

## 2019-10-02 NOTE — Telephone Encounter (Signed)
Medication Refill - Medication: lisinopril (ZESTRIL) 10 MG tablet pantoprazole (PROTONIX) 20 MG tablet    Preferred Pharmacy (with phone number or street name): [id number] - [place], [place] - [address] Phone:  [phone]  Fax:  [phone]       Agent: Please be advised that RX refills may take up to 3 business days. We ask that you follow-up with your pharmacy.

## 2019-10-02 NOTE — Telephone Encounter (Signed)
Current refills until September '21. Requesting refills too soon. Refusing this request.

## 2019-10-16 DIAGNOSIS — Z1231 Encounter for screening mammogram for malignant neoplasm of breast: Secondary | ICD-10-CM | POA: Diagnosis not present

## 2019-11-20 DIAGNOSIS — R922 Inconclusive mammogram: Secondary | ICD-10-CM | POA: Diagnosis not present

## 2019-11-20 LAB — HM MAMMOGRAPHY

## 2019-11-27 ENCOUNTER — Ambulatory Visit: Payer: BC Managed Care – PPO | Admitting: Family Medicine

## 2019-11-27 ENCOUNTER — Other Ambulatory Visit: Payer: Self-pay

## 2019-11-27 ENCOUNTER — Encounter: Payer: Self-pay | Admitting: Family Medicine

## 2019-11-27 VITALS — BP 121/76 | HR 71 | Temp 97.9°F | Ht 60.0 in | Wt 189.0 lb

## 2019-11-27 DIAGNOSIS — I10 Essential (primary) hypertension: Secondary | ICD-10-CM | POA: Diagnosis not present

## 2019-11-27 DIAGNOSIS — Z862 Personal history of diseases of the blood and blood-forming organs and certain disorders involving the immune mechanism: Secondary | ICD-10-CM | POA: Diagnosis not present

## 2019-11-27 DIAGNOSIS — R7303 Prediabetes: Secondary | ICD-10-CM

## 2019-11-27 MED ORDER — PANTOPRAZOLE SODIUM 20 MG PO TBEC: 20.0000 mg | DELAYED_RELEASE_TABLET | Freq: Every day | ORAL | 3 refills | Status: DC

## 2019-11-27 MED ORDER — LISINOPRIL 10 MG PO TABS: 10.0000 mg | ORAL_TABLET | Freq: Every day | ORAL | 3 refills | Status: DC

## 2019-11-27 NOTE — Progress Notes (Signed)
4/26/20214:09 PM  Elizabeth Barr 07-02-1971, 49 y.o., female BP:[id number]  Chief Complaint  Patient presents with  . Gastroesophageal Reflux    pantoprazole not covered by insurance  . Hypertension    HPI:   Patient is a 49 y.o. female with past medical history significant for HTN, prediabetes, subclinical hypothyroidism, GERD who presents today for routine followup  Last OV oct 2020 GERD - omeprazole not beneficial, pantoprazole is costing $85.00/month, taking only once a day and GERD is very controlled Menses very light still regular Has new duties at work, not walking as much anymore   Lab Results  Component Value Date   TSH 0.978 04/13/2019    Lab Results  Component Value Date   HGBA1C 5.9 (H) 04/13/2019   HGBA1C 5.8 (H) 10/14/2018   HGBA1C 5.9 (H) 03/04/2018   Lab Results  Component Value Date   LDLCALC 161 (H) 04/13/2019   CREATININE 0.64 04/13/2019    Depression screen PHQ 2/9 11/27/2019 05/29/2019 05/15/2019  Decreased Interest 0 0 0  Down, Depressed, Hopeless 0 0 0  PHQ - 2 Score 0 0 0  Altered sleeping - - -  Tired, decreased energy - - -  Change in appetite - - -  Feeling bad or failure about yourself  - - -  Trouble concentrating - - -  Moving slowly or fidgety/restless - - -  Suicidal thoughts - - -  PHQ-9 Score - - -  Difficult doing work/chores - - -    Fall Risk  11/27/2019 05/29/2019 05/15/2019 04/13/2019 10/14/2018  Falls in the past year? 0 0 0 0 0  Number falls in past yr: 0 0 0 0 0  Injury with Fall? 0 0 0 0 0  Follow up Falls evaluation completed Falls evaluation completed - - Falls evaluation completed     No Known Allergies  Prior to Admission medications   Medication Sig Start Date End Date Taking? Authorizing Provider  lisinopril (ZESTRIL) 10 MG tablet Take 1 tablet (10 mg total) by mouth daily. 04/13/19  Yes Rutherford Guys, MD  pantoprazole (PROTONIX) 20 MG tablet TOME UNA TABLETA DOS VECES AL DIA WITH A MEAL 08/29/19  Yes  Rutherford Guys, MD  benzonatate (TESSALON) 100 MG capsule Take 1-2 capsules (100-200 mg total) by mouth 3 (three) times daily as needed for cough. Patient not taking: Reported on 11/27/2019 05/15/19   Rutherford Guys, MD  sucralfate (CARAFATE) 1 GM/10ML suspension Take 10 mLs (1 g total) by mouth 4 (four) times daily -  with meals and at bedtime. Patient not taking: Reported on 11/27/2019 05/29/19   Rutherford Guys, MD    Past Medical History:  Diagnosis Date  . Anemia   . Arthritis   . GERD (gastroesophageal reflux disease)   . HTN (hypertension)   . Hyperlipidemia   . Pre-diabetes   . Subclinical hyperthyroidism     Past Surgical History:  Procedure Laterality Date  . CESAREAN SECTION      Social History   Tobacco Use  . Smoking status: Never Smoker  . Smokeless tobacco: Never Used  Substance Use Topics  . Alcohol use: No    Family History  Problem Relation Age of Onset  . Hyperlipidemia Sister     Review of Systems  Constitutional: Negative for chills and fever.  Respiratory: Negative for cough and shortness of breath.   Cardiovascular: Negative for chest pain, palpitations and leg swelling.  Gastrointestinal: Negative for abdominal pain, nausea and vomiting.  OBJECTIVE:  Today's Vitals   11/27/19 1602  BP: 121/76  Pulse: 71  Temp: 97.9 F (36.6 C)  SpO2: 98%  Weight: 189 lb (85.7 kg)  Height: 5' (1.524 m)   Body mass index is 36.91 kg/m.  Wt Readings from Last 3 Encounters:  11/27/19 189 lb (85.7 kg)  05/29/19 185 lb 9.6 oz (84.2 kg)  05/15/19 184 lb (83.5 kg)    Physical Exam Vitals and nursing note reviewed.  Constitutional:      Appearance: She is well-developed.  HENT:     Head: Normocephalic and atraumatic.     Mouth/Throat:     Pharynx: No oropharyngeal exudate.  Eyes:     General: No scleral icterus.    Conjunctiva/sclera: Conjunctivae normal.     Pupils: Pupils are equal, round, and reactive to light.  Cardiovascular:      Rate and Rhythm: Normal rate and regular rhythm.     Heart sounds: Normal heart sounds. No murmur. No friction rub. No gallop.   Pulmonary:     Effort: Pulmonary effort is normal.     Breath sounds: Normal breath sounds. No wheezing or rales.  Musculoskeletal:     Cervical back: Neck supple.  Skin:    General: Skin is warm and dry.  Neurological:     Mental Status: She is alert and oriented to person, place, and time.     No results found for this or any previous visit (from the past 24 hour(s)).  No results found.   ASSESSMENT and PLAN  1. Essential hypertension, benign Controlled. Continue current regime.  - lisinopril (ZESTRIL) 10 MG tablet; Take 1 tablet (10 mg total) by mouth daily. - Comprehensive metabolic panel  2. Prediabetes Checking labs today, medications will be adjusted as needed.  - Hemoglobin A1c  3. History of iron deficiency anemia - CBC - Iron, TIBC and Ferritin Panel  Other orders - pantoprazole (PROTONIX) 20 MG tablet; Take 1 tablet (20 mg total) by mouth daily.  Return in about 6 months (around 05/28/2020).    Rutherford Guys, MD [facility] [place], [place] [address] Ph.  [phone] Fax [phone]

## 2019-11-27 NOTE — Patient Instructions (Signed)
° ° ° °  If you have lab work done today you will be contacted with your lab results within the next 2 weeks.  If you have not heard from us then please contact us. The fastest way to get your results is to register for My Chart. ° ° °IF you received an x-ray today, you will receive an invoice from [facility]. Please contact [facility] at [phone] with questions or concerns regarding your invoice.  ° °IF you received labwork today, you will receive an invoice from [facility]. Please contact [facility] at [phone] with questions or concerns regarding your invoice.  ° °Our billing staff will not be able to assist you with questions regarding bills from these companies. ° °You will be contacted with the lab results as soon as they are available. The fastest way to get your results is to activate your My Chart account. Instructions are located on the last page of this paperwork. If you have not heard from us regarding the results in 2 weeks, please contact this office. °  ° ° ° °

## 2019-11-28 LAB — COMPREHENSIVE METABOLIC PANEL
ALT: 11 IU/L (ref 0–32)
AST: 13 IU/L (ref 0–40)
Albumin/Globulin Ratio: 1.3 (ref 1.2–2.2)
Albumin: 4.3 g/dL (ref 3.8–4.8)
Alkaline Phosphatase: 107 IU/L (ref 39–117)
BUN/Creatinine Ratio: 21 (ref 9–23)
BUN: 14 mg/dL (ref 6–24)
Bilirubin Total: <0.2 mg/dL (ref 0.0–1.2)
CO2: 25 mmol/L (ref 20–29)
Calcium: 9.6 mg/dL (ref 8.7–10.2)
Chloride: 99 mmol/L (ref 96–106)
Creatinine, Ser: 0.66 mg/dL (ref 0.57–1.00)
GFR calc Af Amer: 123 mL/min/{1.73_m2} (ref >59)
GFR calc non Af Amer: 106 mL/min/{1.73_m2} (ref >59)
Globulin, Total: 3.3 g/dL (ref 1.5–4.5)
Glucose: 94 mg/dL (ref 65–99)
Potassium: 4.5 mmol/L (ref 3.5–5.2)
Sodium: 137 mmol/L (ref 134–144)
Total Protein: 7.6 g/dL (ref 6.0–8.5)

## 2019-11-28 LAB — CBC
Hematocrit: 36.7 % (ref 34.0–46.6)
Hemoglobin: 11.2 g/dL (ref 11.1–15.9)
MCH: 22.4 pg — ABNORMAL LOW (ref 26.6–33.0)
MCHC: 30.5 g/dL — ABNORMAL LOW (ref 31.5–35.7)
MCV: 74 fL — ABNORMAL LOW (ref 79–97)
Platelets: 433 10*3/uL (ref 150–450)
RBC: 4.99 x10E6/uL (ref 3.77–5.28)
RDW: 17 % — ABNORMAL HIGH (ref 11.7–15.4)
WBC: 13.2 10*3/uL — ABNORMAL HIGH (ref 3.4–10.8)

## 2019-11-28 LAB — HEMOGLOBIN A1C
Est. average glucose Bld gHb Est-mCnc: 128 mg/dL
Hgb A1c MFr Bld: 6.1 % — ABNORMAL HIGH (ref 4.8–5.6)

## 2019-11-28 LAB — IRON,TIBC AND FERRITIN PANEL
Ferritin: 14 ng/mL — ABNORMAL LOW (ref 15–150)
Iron Saturation: 6 % — CL (ref 15–55)
Iron: 29 ug/dL (ref 27–159)
Total Iron Binding Capacity: 484 ug/dL — ABNORMAL HIGH (ref 250–450)
UIBC: 455 ug/dL — ABNORMAL HIGH (ref 131–425)

## 2019-11-28 MED ORDER — FERROUS GLUCONATE 324 (38 FE) MG PO TABS: 324.0000 mg | ORAL_TABLET | Freq: Every day | ORAL | 0 refills | Status: DC

## 2019-11-28 NOTE — Addendum Note (Signed)
Addended by: Rutherford Guys on: 11/28/2019 01:04 PM   Modules accepted: Orders

## 2020-03-06 DIAGNOSIS — I8311 Varicose veins of right lower extremity with inflammation: Secondary | ICD-10-CM | POA: Diagnosis not present

## 2020-03-08 DIAGNOSIS — I8311 Varicose veins of right lower extremity with inflammation: Secondary | ICD-10-CM | POA: Diagnosis not present

## 2020-03-28 DIAGNOSIS — I8311 Varicose veins of right lower extremity with inflammation: Secondary | ICD-10-CM | POA: Diagnosis not present

## 2020-04-17 DIAGNOSIS — I8311 Varicose veins of right lower extremity with inflammation: Secondary | ICD-10-CM | POA: Diagnosis not present

## 2020-04-20 ENCOUNTER — Other Ambulatory Visit: Payer: Self-pay | Admitting: Family Medicine

## 2020-04-20 DIAGNOSIS — I10 Essential (primary) hypertension: Secondary | ICD-10-CM

## 2020-04-20 NOTE — Telephone Encounter (Signed)
Change of pharmacy Requested Prescriptions  Pending Prescriptions Disp Refills  . lisinopril (ZESTRIL) 10 MG tablet [Pharmacy Med Name: LISINOPRIL 10 MG TABLET] 90 tablet 1    Sig: TOME UNA TABLETA TODOS LOS DIAS     Cardiovascular:  ACE Inhibitors Passed - 04/20/2020  8:58 AM      Passed - Cr in normal range and within 180 days    Creat  Date Value Ref Range Status  02/29/2016 0.61 0.50 - 1.10 mg/dL Final   Creatinine, Ser  Date Value Ref Range Status  11/27/2019 0.66 0.57 - 1.00 mg/dL Final         Passed - K in normal range and within 180 days    Potassium  Date Value Ref Range Status  11/27/2019 4.5 3.5 - 5.2 mmol/L Final         Passed - Patient is not pregnant      Passed - Last BP in normal range    BP Readings from Last 1 Encounters:  11/27/19 121/76         Passed - Valid encounter within last 6 months    Recent Outpatient Visits          4 months ago Essential hypertension, benign   [facility], Lilia Argue, MD   10 months ago Gastroesophageal reflux disease without esophagitis   [facility], Lilia Argue, MD   11 months ago Gastroesophageal reflux disease without esophagitis   [facility], Lilia Argue, MD   1 year ago Essential hypertension, benign   [facility], Lilia Argue, MD   1 year ago Influenza A   [facility], Lilia Argue, MD      Future Appointments            In 1 month Rutherford Guys, MD [facility] [place], [facility]

## 2020-05-08 DIAGNOSIS — I8311 Varicose veins of right lower extremity with inflammation: Secondary | ICD-10-CM | POA: Diagnosis not present

## 2020-05-27 ENCOUNTER — Ambulatory Visit: Payer: BC Managed Care – PPO | Admitting: Family Medicine

## 2020-05-30 DIAGNOSIS — I8312 Varicose veins of left lower extremity with inflammation: Secondary | ICD-10-CM | POA: Diagnosis not present

## 2020-05-30 DIAGNOSIS — I83891 Varicose veins of right lower extremities with other complications: Secondary | ICD-10-CM | POA: Diagnosis not present

## 2020-06-03 DIAGNOSIS — I8312 Varicose veins of left lower extremity with inflammation: Secondary | ICD-10-CM | POA: Diagnosis not present

## 2020-06-12 ENCOUNTER — Other Ambulatory Visit: Payer: Self-pay

## 2020-06-12 ENCOUNTER — Encounter: Payer: Self-pay | Admitting: Family Medicine

## 2020-06-12 ENCOUNTER — Ambulatory Visit: Payer: BC Managed Care – PPO | Admitting: Family Medicine

## 2020-06-12 VITALS — BP 134/82 | HR 60 | Temp 97.9°F | Ht 60.0 in | Wt 183.0 lb

## 2020-06-12 DIAGNOSIS — Z862 Personal history of diseases of the blood and blood-forming organs and certain disorders involving the immune mechanism: Secondary | ICD-10-CM

## 2020-06-12 DIAGNOSIS — E0590 Thyrotoxicosis, unspecified without thyrotoxic crisis or storm: Secondary | ICD-10-CM | POA: Diagnosis not present

## 2020-06-12 DIAGNOSIS — R7303 Prediabetes: Secondary | ICD-10-CM | POA: Diagnosis not present

## 2020-06-12 DIAGNOSIS — E7800 Pure hypercholesterolemia, unspecified: Secondary | ICD-10-CM | POA: Diagnosis not present

## 2020-06-12 DIAGNOSIS — Z23 Encounter for immunization: Secondary | ICD-10-CM

## 2020-06-12 DIAGNOSIS — K219 Gastro-esophageal reflux disease without esophagitis: Secondary | ICD-10-CM | POA: Diagnosis not present

## 2020-06-12 DIAGNOSIS — I8312 Varicose veins of left lower extremity with inflammation: Secondary | ICD-10-CM | POA: Diagnosis not present

## 2020-06-12 DIAGNOSIS — I10 Essential (primary) hypertension: Secondary | ICD-10-CM

## 2020-06-12 DIAGNOSIS — I83812 Varicose veins of left lower extremities with pain: Secondary | ICD-10-CM | POA: Diagnosis not present

## 2020-06-12 MED ORDER — PANTOPRAZOLE SODIUM 20 MG PO TBEC: 20.0000 mg | DELAYED_RELEASE_TABLET | Freq: Every day | ORAL | 3 refills | Status: AC

## 2020-06-12 MED ORDER — LISINOPRIL 10 MG PO TABS: ORAL_TABLET | ORAL | 3 refills | Status: AC

## 2020-06-12 NOTE — Patient Instructions (Addendum)
Schedule Pap at next appointment    Pruebas de deteccin de cncer colorrectal Colorectal Cancer Screening  Las pruebas de deteccin de cncer colorrectal se usan para confirmar o descartar este tipo de cncer antes de que se desarrollen los sntomas. Colorrectal se refiere al colon y al recto. El colon y el recto se encuentran al final del tubo digestivo y all se eliminan las deposiciones hacia fuera del cuerpo. Quin debe realizarse la prueba de deteccin? [employer] Omaha adultos a Proofreader de los 44 aos [facility] 75 aos deben hacerse pruebas de Programme researcher, broadcasting/film/video. El mdico puede recomendarle las pruebas de deteccin a partir de los 57 aos. Le realizarn pruebas cada 1 a 10 aos, segn los [place] y el tipo de prueba de Programme researcher, broadcasting/film/video. Puede realizarse pruebas de deteccin a partir de una edad ms temprana o con ms frecuencia que Standard Pacific, si usted tiene alguno de los siguientes factores de riesgo:  Antecedentes personales o familiares de cncer colorrectal o bultos anormales (plipos).  Una enfermedad inflamatoria del intestino, como colitis ulcerosa o enfermedad de Crohn.  Antecedentes de radioterapia en el abdomen o rea plvica para tratamiento de cncer.  Sntomas de Surveyor, minerals, como cambios en los hbitos intestinales o sangre en las heces.  Un tipo de sndrome de cncer de colon que se transmite de padres a hijos (hereditario), como: ? Sndrome de Field seismologist. ? Poliposis adenomatosa familiar. ? Sndrome de [place]. ? Sndrome de Peutz-Jeghers. Las recomendaciones de pruebas de Programme researcher, broadcasting/film/video para los adultos que tienen entre 52 y 56 aos de edad [place] segn la salud. Cmo se realiza este estudio? Hay diversos tipos de pruebas de Manufacturing systems engineer. Pueden hacerle una o ms de las siguientes:  Prueba de sangre oculta en la materia fecal con guayacol. Para realizar esta prueba, se examina una muestra de heces (materia fecal) a fin de Consulting civil engineer oculta (escondida), lo  que podra ser un signo de Surveyor, minerals.  Prueba inmunoqumica fecal (PIF). Para realizar esta prueba, se examina Truddie Coco de heces a fin de detectar la presencia de [place], lo que podra ser un signo de Surveyor, minerals.  Prueba de cido desoxirribonucleico (ADN) en heces. Para realizar esta prueba, se examina Truddie Coco de heces a fin de Product manager presencia de sangre y cambios en el ADN que podran Cabin crew.  Sigmoidoscopa. Durante esta prueba, se utiliza un tubo flexible y delgado con una cmara en el extremo (sigmoidoscopio) para examinar el recto y la parte inferior del colon.  Colonoscopa. Durante esta prueba, se utiliza un tubo largo y flexible con una cmara en el extremo (colonoscopio) para examinar todo el colon y el recto. Mediante una colonoscopa es posible tomar una muestra de tejido (biopsia) y extirpar pequeos plipos durante la prueba.  Colonoscopa virtual. En lugar de un colonoscopio, en este tipo de colonoscopa se utilizan radiografas (exploracin por tomografa computarizada (TC)) y computadoras para generar imgenes del colon y el recto. Cules son los beneficios de las pruebas de deteccin? Las pruebas de deteccin reducen el riesgo de cncer colorrectal y pueden ayudar a identificar el cncer en una etapa temprana, cuando se puede extirpar o tratar con mayor facilidad. Es comn que se formen plipos en el revestimiento del colon, especialmente a medida que envejece. Estos plipos pueden ser cancerosos o volverse cancerosos con el [place]. Las pruebas de deteccin pueden identificar estos plipos. Cules son los riesgos de la prueba de deteccin? Cada prueba de deteccin puede [employer] Materials.  [place]  muestras de heces tienen menos riesgos que otros tipos de pruebas de Programme researcher, broadcasting/film/video. Sin embargo, es posible que deba realizarse ms pruebas para Consolidated Edison de [place] prueba de [place] de [place].  Las pruebas de  deteccin con radiografas lo exponen a niveles bajos de radiacin, lo que puede aumentar ligeramente el riesgo de Hotel manager. El beneficio de Futures trader cncer supera el ligero aumento del [place].  Las pruebas de Youth worker la sigmoidoscopa y Engineer, materials colonoscopa pueden conllevar riesgo de sangrado, dao intestinal, infeccin o una reaccin a los medicamentos administrados durante el examen. Consulte a su mdico para comprender su riesgo de Therapist, music y para elaborar un plan de deteccin que sea adecuado para usted. Preguntas para hacerle al mdico  Cundo debo comenzar con las pruebas de deteccin del cncer colorrectal?  Cul es mi riesgo de Best boy cncer colorrectal?  Con qu frecuencia tengo que realizarme pruebas de deteccin?  Qu pruebas de deteccin tengo que realizarme?  Cmo obtengo los resultados de la prueba?  [place]ver significan los resultados? Dnde buscar ms informacin Obtenga ms informacin sobre las pruebas de deteccin del Surveyor, minerals en los siguientes sitios:  Sociedad Museum/gallery conservator ([place]): www.cancer.[place] ([place]): www.cancer.gov Resumen  Las pruebas de deteccin de Surveyor, minerals se usan para Firefighter o Teaching laboratory technician tipo de cncer antes de que se desarrollen los sntomas.  Las pruebas de deteccin reducen el riesgo de cncer colorrectal y pueden ayudar a identificar el cncer en una etapa temprana, cuando se puede extirpar o tratar con mayor facilidad.  [employer] of Omaha adultos a Proofreader de los 50 aos [facility] 75 aos deben hacerse pruebas de Programme researcher, broadcasting/film/video. El mdico puede recomendarle las pruebas de deteccin a partir de los 39 aos.  Puede realizarse pruebas de deteccin a partir de una edad ms temprana o con ms frecuencia que Standard Pacific, si usted tiene determinados factores de Sales executive.  Consulte a su mdico para comprender su riesgo de Chief Operating Officer y para elaborar un plan de deteccin que sea adecuado para usted. Esta informacin no tiene Marine scientist el consejo del mdico. Asegrese de hacerle al mdico cualquier pregunta que tenga. Document Revised: 08/25/2017 Document Reviewed: 06/18/2017 Elsevier Patient Education  2020 [place] Maintenance, Female Adoptar un estilo de vida saludable y recibir atencin preventiva son importantes para promover la salud y Musician. Consulte al mdico sobre:  El esquema adecuado para hacerse pruebas y exmenes peridicos.  Cosas que puede hacer por su cuenta para prevenir enfermedades y [employer]. Qu debo saber sobre la dieta, el peso y el ejercicio? Consuma una dieta saludable   Consuma una dieta que incluya muchas verduras, frutas, productos lcteos con bajo contenido de [place] y Advertising account planner.  No consuma muchos alimentos ricos en grasas slidas, azcares agregados o sodio. Mantenga un peso saludable El ndice de masa muscular [facility]) se [place] the [place] para identificar problemas de [place]. Proporciona una estimacin de la grasa corporal basndose en el peso y la altura. Su mdico puede ayudarle a Radiation protection practitioner [place] y a Scientist, forensic o Theatre manager un peso saludable. Haga ejercicio con regularidad Haga ejercicio con regularidad. Esta es una de las prcticas ms importantes que puede hacer por su salud. La mayora de los adultos deben seguir estas pautas:  Optometrist, al menos, 127minutos de actividad fsica por semana. El ejercicio debe aumentar la frecuencia cardaca y Nature conservation officer transpirar (ejercicio de  intensidad moderada).  Hacer ejercicios de fortalecimiento por lo [employer] por semana. Agregue esto a su plan de ejercicio de intensidad moderada.  Pasar menos tiempo sentados. Incluso la actividad fsica ligera puede ser beneficiosa. Controle sus niveles de colesterol y lpidos en la sangre Comience a realizarse anlisis de  lpidos y Research officer, trade union en la sangre a los 20aos y luego reptalos cada 5aos. Hgase controlar los niveles de colesterol con mayor frecuencia si:  Sus niveles de lpidos y colesterol son altos.  Es mayor de 40aos.  Presenta un alto riesgo de padecer enfermedades cardacas. Qu debo saber sobre las pruebas de deteccin del cncer? Segn su historia clnica y sus antecedentes familiares, es posible que deba realizarse pruebas de deteccin del cncer en diferentes edades. Esto puede incluir pruebas de deteccin de lo siguiente:  Cncer de mama.  Cncer de cuello uterino.  Cncer colorrectal.  Cncer de piel.  Cncer de pulmn. Qu debo saber sobre la enfermedad cardaca, la diabetes y la hipertensin arterial? Presin arterial y enfermedad cardaca  La hipertensin arterial causa enfermedades cardacas y [place] el riesgo de accidente cerebrovascular. Es ms probable que esto se manifieste en las personas que tienen lecturas de presin arterial alta, tienen ascendencia africana o tienen sobrepeso.  Hgase controlar la presin arterial: ? Cada 3 a 5 aos si tiene entre 18 y 49 aos. ? Todos los aos si es mayor de [place]. Diabetes Realcese exmenes de deteccin de la diabetes con regularidad. Este anlisis revisa el nivel de azcar en la sangre en [place]. Hgase las pruebas de deteccin:  Cada tresaos despus de los 42aos de edad si tiene un peso normal y un bajo riesgo de padecer diabetes.  Con ms frecuencia y a partir de [place] edad inferior si tiene sobrepeso o un alto riesgo de padecer diabetes. Qu debo saber sobre la prevencin de infecciones? Hepatitis B Si tiene un riesgo ms alto de contraer hepatitis B, debe someterse a un examen de deteccin de este virus. Hable con el mdico para averiguar si tiene riesgo de contraer la infeccin por hepatitis B. Hepatitis C Se recomienda el anlisis a:  [employer] 1945 y 1965.  Todas las personas que  tengan un riesgo de haber contrado hepatitis C. Enfermedades de transmisin sexual (ETS)  Hgase las pruebas de Programme researcher, broadcasting/film/video de ITS, incluidas la gonorrea y la clamidia, si: ? Es sexualmente activa y es menor de [place]. ? Es mayor de 24aos, y Investment banker, operational informa que corre riesgo de tener este tipo de infecciones. ? La actividad sexual ha cambiado desde que le hicieron la ltima prueba de deteccin y tiene un riesgo mayor de Best boy clamidia o Radio broadcast assistant. Pregntele al mdico si usted tiene riesgo.  Pregntele al mdico si usted tiene un alto riesgo de Museum/gallery curator VIH. El mdico tambin puede recomendarle un medicamento recetado para ayudar a evitar la infeccin por el VIH. Si elige tomar medicamentos para prevenir el VIH, primero debe [employer] Pride de deteccin del VIH. Luego debe hacerse anlisis cada 13meses mientras est tomando los medicamentos. Embarazo  Si est por dejar de Librarian, academic (fase premenopusica) y usted puede quedar [place], busque asesoramiento antes de [place].  Tome de 400 a [id number] (mcg) de cido [employer] si [place].  Pida mtodos de control de la natalidad (anticonceptivos) si desea evitar un embarazo no deseado. Osteoporosis y [place] La osteoporosis es una enfermedad en la que los huesos pierden los minerales y la fuerza por el  avance de la edad. El resultado pueden ser fracturas en los [place]. Si tiene 65aos o ms, o si est en riesgo de sufrir osteoporosis y fracturas, pregunte a su mdico si debe:  Hacerse pruebas de deteccin de prdida sea.  Tomar un suplemento de calcio o de vitamina D para reducir el riesgo de fracturas.  Recibir terapia de reemplazo hormonal (TRH) para tratar los sntomas de la menopausia. Siga estas instrucciones en su casa: Estilo de vida  No consuma ningn producto que contenga nicotina o tabaco, como cigarrillos, cigarrillos electrnicos y tabaco de Higher education careers adviser. Si necesita ayuda para dejar de  fumar, consulte al mdico.  No consuma drogas.  No comparta agujas.  Solicite ayuda a su mdico si necesita apoyo o informacin para abandonar las drogas. Consumo de alcohol  No beba alcohol si: ? Su mdico le indica no hacerlo. ? Est embarazada, puede estar embarazada o est tratando de quedar embarazada.  Si bebe alcohol: ? Limite la cantidad que consume de 0 a 1 medida por da. ? Limite la ingesta si est amamantando.  Est atento a la cantidad de alcohol que hay en las bebidas que toma. En los [place], una medida equivale a una botella de cerveza de 12oz (342ml), un vaso de vino de 5oz (146ml) o un vaso de una bebida alcohlica de alta graduacin de 1oz (9ml). Instrucciones generales  Realcese los estudios de rutina de la salud, dentales y de Public librarian.  [place].  Infrmele a su mdico si: ? Se siente deprimida con frecuencia. ? Alguna vez ha sido vctima de [place] o no se siente segura en su casa. Resumen  Adoptar un estilo de vida saludable y recibir atencin preventiva son importantes para promover la salud y Musician.  Siga las instrucciones del mdico acerca de una dieta saludable, el ejercicio y la realizacin de pruebas o exmenes para Engineer, building services.  Siga las instrucciones del mdico con respecto al control del colesterol y la presin arterial. Esta informacin no tiene Marine scientist el consejo del mdico. Asegrese de hacerle al mdico cualquier pregunta que tenga. Document Revised: 08/10/2018 Document Reviewed: 08/10/2018 Elsevier Patient Education  [place] menopausia Menopause La menopausia es el perodo normal de la vida en el que los perodos menstruales cesan por completo. Por lo general, se confirma tras 22meses de no haber tenido un perodo menstrual. La transicin a la menopausia (perimenopausia), con mayor frecuencia, ocurre entre los 61 y los 55aos. Durante la  perimenopausia, los niveles hormonales cambian en el [place], lo que puede provocar sntomas y Psychologist, occupational. La menopausia podra aumentar el riesgo de sufrir lo siguiente:  Prdida de masa sea (osteoporosis), lo que predispone a la rotura de huesos (fracturas).  Depresin.  Endurecimiento y Public librarian de las arterias (aterosclerosis), lo que puede causar infartos de miocardio y accidentes cerebrovasculares. Cules son las causas? A menudo, la causa de esta afeccin es un cambio natural en los niveles hormonales, que ocurre a medida que envejece. La afeccin tambin podra ser provocada por una ciruga en la que se extraigan ambos ovarios (ooforectoma bilateral). Qu incrementa el riesgo? Es ms probable que esta afeccin comience a una temprana edad si tiene ciertas afecciones o se realiza ciertos tratamientos, incluidos los siguientes:  Un tumor en la hipfisis del cerebro.  Una enfermedad que afecte los ovarios y la produccin de hormonas.  Radioterapia para tratar Science writer.  Ciertos tratamientos contra el cncer, como quimioterapia o  terapia hormonal (antiestrgeno).  Fumar mucho y consumir alcohol de forma excesiva.  Antecedentes familiares de menopausia temprana. Adems, es ms probable que esta afeccin se presente de manera temprana en las mujeres que son [place]. Cules son los signos o los sntomas? Los sntomas de esta afeccin incluyen los siguientes:  Nurse, learning disability.  Perodos menstruales irregulares.  Sudoracin nocturna.  Cambios en el sentimiento respecto de las [employer]. Es posible que el deseo sexual disminuya o que se sienta ms cmoda respecto de su sexualidad.  Sequedad vaginal y adelgazamiento de las paredes de la vagina. Esto podra hacer que sienta dolor durante las relaciones sexuales.  Sequedad de la piel y aparicin de Glass blower/designer.  Dolores de [place].  Problemas para dormir (insomnio).  Cambios de humor o  irritabilidad.  Problemas de memoria.  Aumento de [place].  Crecimiento de bello en la cara y el pecho.  Infecciones en la vejiga o problemas para orinar. Cmo se diagnostica? Esta afeccin se diagnostica en funcin de los antecedentes mdicos, un examen fsico, la edad, los antecedentes menstruales y los sntomas. Tambin le podran realizar estudios hormonales. Cmo se trata? En algunos los casos, no se necesita tratamiento. Usted y el mdico deben decidir juntos si se debe Arts administrator. El tratamiento se determinar en funcin de su cuadro clnico y de sus preferencias. El tratamiento de este cuadro clnico se centra en el control de los sntomas. El tratamiento puede incluir lo siguiente:  Terapia hormonal para la menopausia.  Medicamentos para tratar sntomas o complicaciones especficos.  Acupuntura.  Vitaminas o suplementos herbales. Antes de Biochemist, clinical, es importante que le avise al mdico si tiene antecedentes personales o familiares de lo siguiente:  Enfermedades cardacas.  Cncer de mama.  Cogulos de [place].  Diabetes.  Osteoporosis. Siga estas indicaciones en su casa: Estilo de vida  No consuma ningn producto que contenga nicotina o tabaco, como cigarrillos y Psychologist, sport and exercise. Si necesita ayuda para dejar de fumar, consulte al mdico.  Realice, por lo menos, [id number] de actividad fsica 5das por semana o ms.  Evite las bebidas con alcohol o cafena, as como las [employer]. Esto podra ayudar a prevenir los acaloramientos.  Intente dormir de 7 a 8horas todas las noches.  Si tiene acaloramientos: ? Vstase en capas. ? Evite las cosas que podran [employer] acaloramientos, como las comidas muy condimentadas, los lugares calientes o el estrs. ? Respire profundamente y despacio cuando comience a Chief Executive Officer. ? Tenga un ventilador en su casa y en su oficina.  Encuentre modos de [employer], por ejemplo, a travs de la respiracin, meditacin o un diario ntimo.  Considere la posibilidad de asistir a terapia grupal con otras mujeres que tengan sntomas de [place]. [place] recomendaciones al [employer] reuniones de terapia grupal. Comida y bebida  Siga una dieta saludable y equilibrada que incluya cereales integrales, protenas magras, productos lcteos descremados y [place] frutas y verduras.  El mdico podra recomendarle que agregue una mayor cantidad de soja a su dieta. Algunos de los alimentos que contienen soja son el tofu, el tempeh y [place].  Consuma muchos alimentos que contengan calcio y vitaminaD para Engineer, manufacturing systems salud sea. Algunos productos que contienen mucho calcio son los [employer]Shack, el yogur, los frijoles, las Baxter, las sardinas, el brcoli y la col rizada. Medicamentos  Delphi de venta libre y los recetados solamente como se lo haya indicado el mdico.  Hable con el mdico antes de  empezar a tomar cualquier suplemento herbal. [place] vitaminas y los suplementos recetados como se lo haya indicado el mdico. Estos pueden incluir lo siguiente: ? Calcio. Las mujeres de 51aos o ms deben consumir 1200mg  (miligramos) de [employer]. ? VitaminaD. Las mujeres necesitan entre 600 y 800unidades internacionales de [place]. ? VitaminasB12 yB6. Procure consumir 8microgramos de vitaminaB12 y 1,5mg  de vitaminaB6 todos los das. Instrucciones generales  Lleve un registro de sus perodos menstruales; incluya lo siguiente: ? El momento en que ocurren. ? Qu tan abundantes son y cunto duran. ? Cunto tiempo transcurre entre cada perodo menstrual.  Lleve un registro de los sntomas; anote cundo comienzan, con qu frecuencia ocurren y cunto duran.  Use lubricantes o humectantes vaginales para aliviar la sequedad vaginal y Research officer, political party [place].  Concurra  a todas las visitas de control como se lo haya indicado el mdico. Esto es importante. Esto incluye la terapia grupal y la psicoterapia. Comunquese con un mdico si:  An tiene perodos menstruales despus de los 55aos.  Siente dolor durante las [employer].  No tuvo perodos menstruales durante los ltimos 71meses y presenta sangrado vaginal. Solicite ayuda de inmediato si:  Tiene los siguientes sntomas: ? Depresin grave. ? Sangrado vaginal excesivo. ? Dolor al [employer] Airlines. ? Latidos cardacos rpidos o irregulares (palpitaciones). ? Dolor de cabeza intenso. ? Dolor en el abdomen (abdominal) o dispepsia grave.  Se cay y cree que se ha fracturado un hueso.  Siente dolor en el pecho o en la pierna.  Desarrolla problemas de visin.  Se toca un bulto en la mama. Resumen  La menopausia es el perodo normal de la vida en el que los perodos menstruales cesan por completo. Por lo general, se confirma tras 23meses de no haber tenido un perodo menstrual.  La transicin a la menopausia (perimenopausia), con mayor frecuencia, ocurre entre los 67 y los 55aos.  Los sntomas pueden controlarse mediante medicamentos, cambios en el estilo de vida y terapias complementarias, como acupuntura.  Siga una dieta equilibrada que incluya muchos nutrientes para Psychologist, counselling salud sea y la salud cardaca, y para Chief Technology Officer los sntomas durante la [place]. Esta informacin no tiene Marine scientist el consejo del mdico. Asegrese de hacerle al mdico cualquier pregunta que tenga. Document Revised: 04/15/2017 Document Reviewed: 04/15/2017 Elsevier Patient Education  [place] [employer].   If you have lab work done today you will be contacted with your lab results within the next 2 weeks.  If you have not heard from [place] then please contact us. The fastest way to get your results is to register for My Chart.   IF you received an x-ray today, you will receive an invoice from [facility]  Radiology. Please contact [facility] Radiology at [phone] with questions or concerns regarding your invoice.   IF you received labwork today, you will receive an invoice from [place]. Please contact [facility] at [phone] with questions or concerns regarding your invoice.   Our billing staff will not be able to assist you with questions regarding bills from these companies.  You will be contacted with the lab results as soon as they are available. The fastest way to get your results is to activate your My Chart account. Instructions are located on the last page of this paperwork. If you have not heard from [place] regarding the results in 2 weeks, please contact this office.

## 2020-06-12 NOTE — Progress Notes (Addendum)
11/10/20214:01 PM  Elizabeth Barr 05-30-1971, 49 y.o., female [id number]  Chief Complaint  Patient presents with  . Hypertension  . 6 month follow    HPI:   Patient is a 49 y.o. female with past medical history significant for HTN, prediabetes, subclinical hypothyroidism, GERD who presents today for routine followup    Essential hypertension, benign Controlled. Continue current regime.  Goal< 140/90 - lisinopril (ZESTRIL) 10 MG tablet; Take 1 tablet (10 mg total) by mouth daily. BP Readings from Last 3 Encounters:  06/12/20 134/82  11/27/19 121/76  05/29/19 129/83   The 10-year ASCVD risk score Mikey Bussing DC Jr., et al., 2013) is: 1.8%   Values used to calculate the score:     Age: 89 years     Sex: Female     Is Non-Hispanic African American: No     Diabetic: No     Tobacco smoker: No     Systolic Blood Pressure: 846 mmHg     Is BP treated: Yes     HDL Cholesterol: 51 mg/dL     Total Cholesterol: 229 mg/dL Lab Results  Component Value Date   CHOL 229 (H) 04/13/2019   HDL 51 04/13/2019   LDLCALC 161 (H) 04/13/2019   TRIG 96 04/13/2019   CHOLHDL 4.5 (H) 04/13/2019     Prediabetes Checking labs today, medications will be adjusted as needed.  Lab Results  Component Value Date   HGBA1C 6.1 (H) 11/27/2019     History of iron deficiency anemia Lab Results  Component Value Date   WBC 13.2 (H) 11/27/2019   HGB 11.2 11/27/2019   HCT 36.7 11/27/2019   MCV 74 (L) 11/27/2019   PLT 433 11/27/2019   Lab Results  Component Value Date   IRON 29 11/27/2019   TIBC 484 (H) 11/27/2019   FERRITIN 14 (L) 11/27/2019   Has not taken iron in more than a year  GERD - pantoprazole (PROTONIX) 20 MG tablet; Take 1 tablet (20 mg total) by mouth daily. omeprazole not beneficial,  pantoprazole was costing $85.00/month, found cheaper at [place] taking only once a day  GERD is very controlled   Screening Hepatitis C: declines Covid: done NGE:XBMWU Mammogram:  11/20/19 PaP: normal and HPV neg: 06/12/2016: due 2022 Colon Cancer: no family history. Information sent, will decide next appointment. Not having regular periods: sometimes twice a month sometimes every other month LMP: Oct 13th   Depression screen [facility] 2/9 06/12/2020 11/27/2019 05/29/2019  Decreased Interest 0 0 0  Down, Depressed, Hopeless 0 0 0  PHQ - 2 Score 0 0 0  Altered sleeping - - -  Tired, decreased energy - - -  Change in appetite - - -  Feeling bad or failure about yourself  - - -  Trouble concentrating - - -  Moving slowly or fidgety/restless - - -  Suicidal thoughts - - -  PHQ-9 Score - - -  Difficult doing work/chores - - -    Fall Risk  06/12/2020 11/27/2019 05/29/2019 05/15/2019 04/13/2019  Falls in the past year? 0 0 0 0 0  Number falls in past yr: 0 0 0 0 0  Injury with Fall? 0 0 0 0 0  Follow up Falls evaluation completed Falls evaluation completed Falls evaluation completed - -     No Known Allergies  Prior to Admission medications   Medication Sig Start Date End Date Taking? Authorizing Provider  lisinopril (ZESTRIL) 10 MG tablet Take 1 tablet (10 mg total) by  mouth daily. 04/13/19  Yes Rutherford Guys, MD  pantoprazole (PROTONIX) 20 MG tablet TOME UNA TABLETA DOS VECES AL DIA WITH A MEAL 08/29/19  Yes Rutherford Guys, MD  benzonatate (TESSALON) 100 MG capsule Take 1-2 capsules (100-200 mg total) by mouth 3 (three) times daily as needed for cough. Patient not taking: Reported on 11/27/2019 05/15/19   Rutherford Guys, MD  sucralfate (CARAFATE) 1 GM/10ML suspension Take 10 mLs (1 g total) by mouth 4 (four) times daily -  with meals and at bedtime. Patient not taking: Reported on 11/27/2019 05/29/19   Rutherford Guys, MD    Past Medical History:  Diagnosis Date  . Anemia   . Arthritis   . GERD (gastroesophageal reflux disease)   . HTN (hypertension)   . Hyperlipidemia   . Pre-diabetes   . Subclinical hyperthyroidism     Past Surgical History:   Procedure Laterality Date  . CESAREAN SECTION      Social History   Tobacco Use  . Smoking status: Never Smoker  . Smokeless tobacco: Never Used  Substance Use Topics  . Alcohol use: No    Family History  Problem Relation Age of Onset  . Hyperlipidemia Sister     Review of Systems  Constitutional: Negative for chills and fever.  Respiratory: Negative for cough and shortness of breath.   Cardiovascular: Negative for chest pain, palpitations and leg swelling.  Gastrointestinal: Negative for abdominal pain, nausea and vomiting.     OBJECTIVE:  Today's Vitals   06/12/20 1532  BP: 134/82  Pulse: 60  Temp: 97.9 F (36.6 C)  SpO2: 98%  Weight: 183 lb (83 kg)  Height: 5' (1.524 m)   Body mass index is 35.74 kg/m.  Wt Readings from Last 3 Encounters:  06/12/20 183 lb (83 kg)  11/27/19 189 lb (85.7 kg)  05/29/19 185 lb 9.6 oz (84.2 kg)    Physical Exam Vitals and nursing note reviewed.  Constitutional:      Appearance: She is well-developed.  HENT:     Head: Normocephalic and atraumatic.     Mouth/Throat:     Pharynx: No oropharyngeal exudate.  Eyes:     General: No scleral icterus.    Conjunctiva/sclera: Conjunctivae normal.     Pupils: Pupils are equal, round, and reactive to light.  Cardiovascular:     Rate and Rhythm: Normal rate and regular rhythm.     Heart sounds: Normal heart sounds. No murmur heard.  No friction rub. No gallop.   Pulmonary:     Effort: Pulmonary effort is normal.     Breath sounds: Normal breath sounds. No wheezing or rales.  Musculoskeletal:     Cervical back: Neck supple.  Skin:    General: Skin is warm and dry.  Neurological:     Mental Status: She is alert and oriented to person, place, and time.     No results found for this or any previous visit (from the past 24 hour(s)).  No results found.   ASSESSMENT and PLAN  Problem List Items Addressed This Visit      Cardiovascular and Mediastinum   Essential  hypertension, benign   Relevant Medications   lisinopril (ZESTRIL) 10 MG tablet     Endocrine   Subclinical hyperthyroidism   Relevant Orders   TSH     Other   Prediabetes - Primary   Relevant Orders   Hemoglobin A1c    Other Visit Diagnoses    Gastroesophageal reflux disease without  esophagitis       Relevant Medications   pantoprazole (PROTONIX) 20 MG tablet   Other Relevant Orders   Vitamin D, 25-hydroxy   History of iron deficiency anemia       Relevant Orders   CBC   Iron, TIBC and Ferritin Panel   Reticulocytes   Pure hypercholesterolemia       Relevant Medications   lisinopril (ZESTRIL) 10 MG tablet   Other Relevant Orders   Lipid Panel   Essential hypertension       Relevant Medications   lisinopril (ZESTRIL) 10 MG tablet   Other Relevant Orders   Comprehensive metabolic panel   Encounter for immunization       Relevant Orders   Flu Vaccine QUAD 6+ mos PF IM (Fluarix Quad PF)     Will follow up with lab results  Needs pap next visit Will further discuss colon cancer screening next visit, declined at this time.  Return in about 6 months (around 12/10/2020).  I have reviewed and agree with above documentation. Agustina Caroli, MD   Thank you for your visit with Primary Care at [facility] today.  Huston Foley Brysin Towery, FNP-BC [facility] Primary [facility] at [place], [place] [address] [phone] - 0000

## 2020-06-13 ENCOUNTER — Other Ambulatory Visit: Payer: Self-pay | Admitting: Family Medicine

## 2020-06-13 DIAGNOSIS — Z862 Personal history of diseases of the blood and blood-forming organs and certain disorders involving the immune mechanism: Secondary | ICD-10-CM

## 2020-06-13 LAB — COMPREHENSIVE METABOLIC PANEL
ALT: 11 IU/L (ref 0–32)
AST: 15 IU/L (ref 0–40)
Albumin/Globulin Ratio: 1.6 (ref 1.2–2.2)
Albumin: 4.4 g/dL (ref 3.8–4.8)
Alkaline Phosphatase: 92 IU/L (ref 44–121)
BUN/Creatinine Ratio: 22 (ref 9–23)
BUN: 17 mg/dL (ref 6–24)
Bilirubin Total: <0.2 mg/dL (ref 0.0–1.2)
CO2: 24 mmol/L (ref 20–29)
Calcium: 9.6 mg/dL (ref 8.7–10.2)
Chloride: 102 mmol/L (ref 96–106)
Creatinine, Ser: 0.78 mg/dL (ref 0.57–1.00)
GFR calc Af Amer: 105 mL/min/{1.73_m2} (ref >59)
GFR calc non Af Amer: 91 mL/min/{1.73_m2} (ref >59)
Globulin, Total: 2.8 g/dL (ref 1.5–4.5)
Glucose: 91 mg/dL (ref 65–99)
Potassium: 4.6 mmol/L (ref 3.5–5.2)
Sodium: 138 mmol/L (ref 134–144)
Total Protein: 7.2 g/dL (ref 6.0–8.5)

## 2020-06-13 LAB — IRON,TIBC AND FERRITIN PANEL
Ferritin: 21 ng/mL (ref 15–150)
Iron Saturation: 13 % — ABNORMAL LOW (ref 15–55)
Iron: 57 ug/dL (ref 27–159)
Total Iron Binding Capacity: 437 ug/dL (ref 250–450)
UIBC: 380 ug/dL (ref 131–425)

## 2020-06-13 LAB — CBC
Hematocrit: 35.3 % (ref 34.0–46.6)
Hemoglobin: 11 g/dL — ABNORMAL LOW (ref 11.1–15.9)
MCH: 23.1 pg — ABNORMAL LOW (ref 26.6–33.0)
MCHC: 31.2 g/dL — ABNORMAL LOW (ref 31.5–35.7)
MCV: 74 fL — ABNORMAL LOW (ref 79–97)
Platelets: 430 10*3/uL (ref 150–450)
RBC: 4.76 x10E6/uL (ref 3.77–5.28)
RDW: 15.5 % — ABNORMAL HIGH (ref 11.7–15.4)
WBC: 10.2 10*3/uL (ref 3.4–10.8)

## 2020-06-13 LAB — LIPID PANEL
Chol/HDL Ratio: 5.6 ratio — ABNORMAL HIGH (ref 0.0–4.4)
Cholesterol, Total: 234 mg/dL — ABNORMAL HIGH (ref 100–199)
HDL: 42 mg/dL (ref >39)
LDL Chol Calc (NIH): 169 mg/dL — ABNORMAL HIGH (ref 0–99)
Triglycerides: 125 mg/dL (ref 0–149)
VLDL Cholesterol Cal: 23 mg/dL (ref 5–40)

## 2020-06-13 LAB — VITAMIN D 25 HYDROXY (VIT D DEFICIENCY, FRACTURES): Vit D, 25-Hydroxy: 24.3 ng/mL — ABNORMAL LOW (ref 30.0–100.0)

## 2020-06-13 LAB — RETICULOCYTES: Retic Ct Pct: 1.1 % (ref 0.6–2.6)

## 2020-06-13 LAB — HEMOGLOBIN A1C
Est. average glucose Bld gHb Est-mCnc: 120 mg/dL
Hgb A1c MFr Bld: 5.8 % — ABNORMAL HIGH (ref 4.8–5.6)

## 2020-06-13 LAB — TSH: TSH: 1.66 u[IU]/mL (ref 0.450–4.500)

## 2020-06-13 MED ORDER — FERROUS SULFATE 324 (65 FE) MG PO TBEC: 1.0000 | DELAYED_RELEASE_TABLET | Freq: Two times a day (BID) | ORAL | 3 refills | Status: AC

## 2020-06-24 DIAGNOSIS — I8312 Varicose veins of left lower extremity with inflammation: Secondary | ICD-10-CM | POA: Diagnosis not present

## 2020-06-24 DIAGNOSIS — I83812 Varicose veins of left lower extremities with pain: Secondary | ICD-10-CM | POA: Diagnosis not present

## 2020-06-24 DIAGNOSIS — M7981 Nontraumatic hematoma of soft tissue: Secondary | ICD-10-CM | POA: Diagnosis not present

## 2020-07-08 DIAGNOSIS — I8312 Varicose veins of left lower extremity with inflammation: Secondary | ICD-10-CM | POA: Diagnosis not present

## 2020-07-24 ENCOUNTER — Encounter: Payer: BC Managed Care – PPO | Admitting: Family Medicine

## 2020-08-12 DIAGNOSIS — I83812 Varicose veins of left lower extremities with pain: Secondary | ICD-10-CM | POA: Diagnosis not present

## 2020-08-12 DIAGNOSIS — I8312 Varicose veins of left lower extremity with inflammation: Secondary | ICD-10-CM | POA: Diagnosis not present

## 2020-08-13 DIAGNOSIS — I8312 Varicose veins of left lower extremity with inflammation: Secondary | ICD-10-CM | POA: Diagnosis not present

## 2020-08-13 DIAGNOSIS — I83812 Varicose veins of left lower extremities with pain: Secondary | ICD-10-CM | POA: Diagnosis not present

## 2020-09-28 IMAGING — DX DG CHEST 2V
2 series · 2 of 2 positions shown · non-contrast
Comparison: Chest x-ray dated 04/29/2019 and chest CT dated
11/17/2007

CLINICAL DATA: Cough and elevated white blood count. Recent
community acquired pneumonia.

EXAM:
CHEST - 2 VIEW

[chest pa]
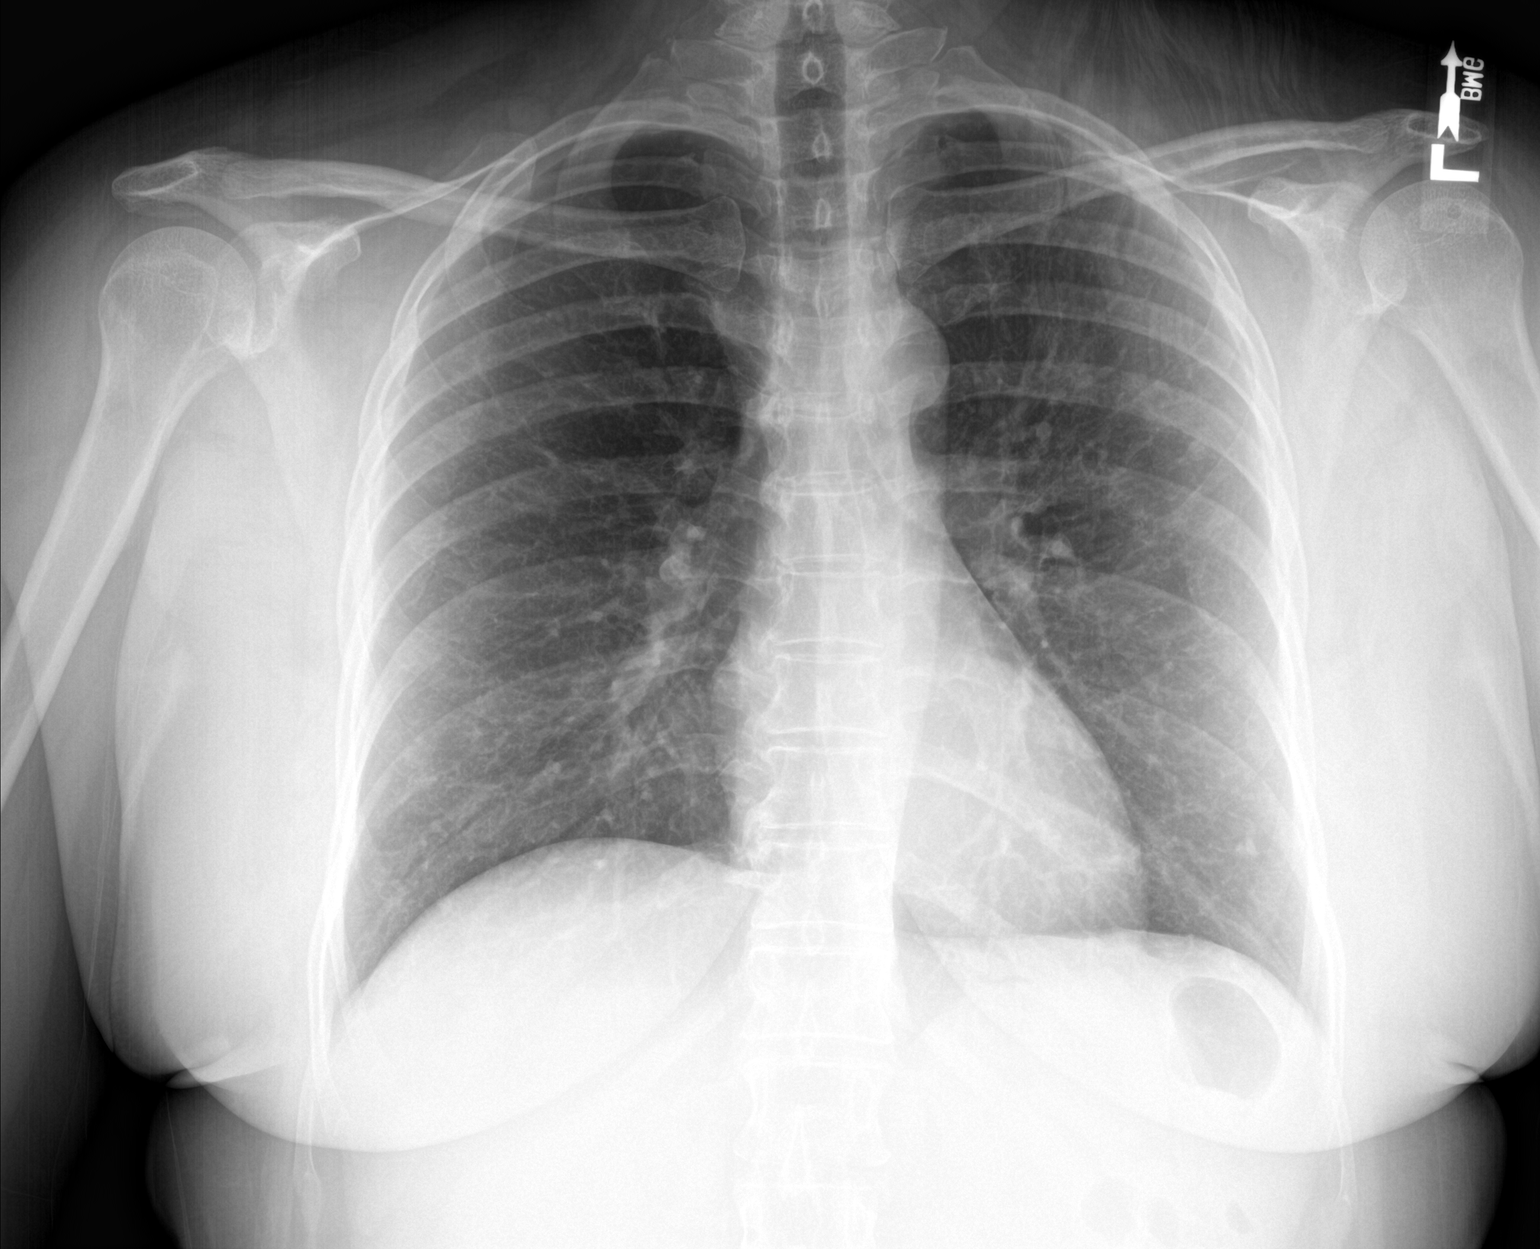

[chest lat]
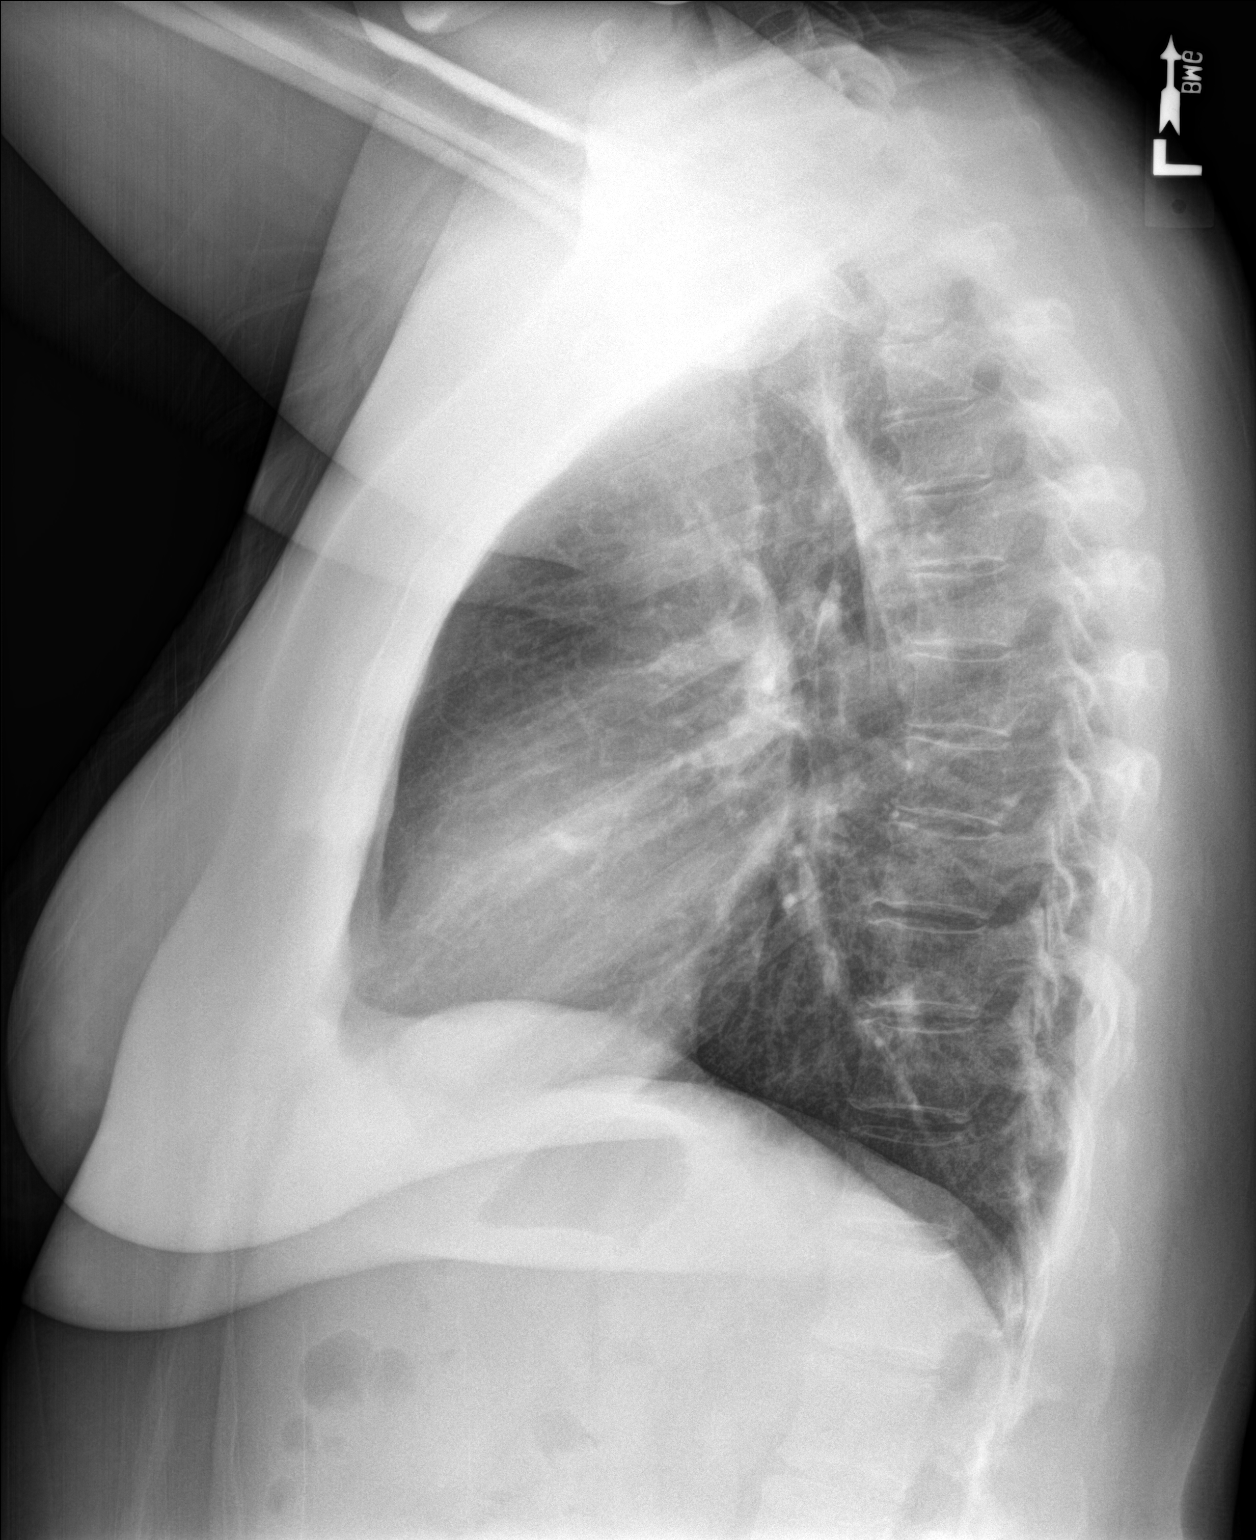

[2 of 2 positions shown; findings below may reference images not displayed]

FINDINGS: The heart size and pulmonary vascularity are normal. No effusions.
There has been complete clearing of the right lung. There are tiny
residual nodular densities peripherally in the left midzone and left
base which most likely represent granulomas. The nodule at the left
lung base laterally is demonstrated to be a granuloma on the
previous abdominal CT scan dated 11/17/2007.
IMPRESSION: Clearing of the bilateral pulmonary infiltrates. Granulomas in the
left lung.

## 2020-11-12 DIAGNOSIS — I8312 Varicose veins of left lower extremity with inflammation: Secondary | ICD-10-CM | POA: Diagnosis not present

## 2020-11-12 DIAGNOSIS — I83812 Varicose veins of left lower extremities with pain: Secondary | ICD-10-CM | POA: Diagnosis not present

## 2020-12-10 ENCOUNTER — Ambulatory Visit: Payer: BC Managed Care – PPO | Admitting: Family Medicine

## 2020-12-10 DIAGNOSIS — E7800 Pure hypercholesterolemia, unspecified: Secondary | ICD-10-CM | POA: Diagnosis not present

## 2020-12-10 DIAGNOSIS — E785 Hyperlipidemia, unspecified: Secondary | ICD-10-CM | POA: Diagnosis not present

## 2020-12-10 DIAGNOSIS — I10 Essential (primary) hypertension: Secondary | ICD-10-CM | POA: Diagnosis not present

## 2020-12-10 DIAGNOSIS — R7303 Prediabetes: Secondary | ICD-10-CM | POA: Diagnosis not present

## 2020-12-14 DIAGNOSIS — E785 Hyperlipidemia, unspecified: Secondary | ICD-10-CM | POA: Diagnosis not present

## 2020-12-14 DIAGNOSIS — R7303 Prediabetes: Secondary | ICD-10-CM | POA: Diagnosis not present

## 2021-01-09 DIAGNOSIS — Z1231 Encounter for screening mammogram for malignant neoplasm of breast: Secondary | ICD-10-CM | POA: Diagnosis not present

## 2021-01-09 DIAGNOSIS — Z1211 Encounter for screening for malignant neoplasm of colon: Secondary | ICD-10-CM | POA: Diagnosis not present

## 2021-01-09 DIAGNOSIS — Z0000 Encounter for general adult medical examination without abnormal findings: Secondary | ICD-10-CM | POA: Diagnosis not present

## 2021-01-09 DIAGNOSIS — Z124 Encounter for screening for malignant neoplasm of cervix: Secondary | ICD-10-CM | POA: Diagnosis not present

## 2021-01-27 DIAGNOSIS — U071 COVID-19: Secondary | ICD-10-CM | POA: Diagnosis not present

## 2021-03-13 DIAGNOSIS — K011 Impacted teeth: Secondary | ICD-10-CM | POA: Diagnosis not present

## 2021-07-21 DIAGNOSIS — R7303 Prediabetes: Secondary | ICD-10-CM | POA: Diagnosis not present

## 2022-05-29 ENCOUNTER — Ambulatory Visit: Payer: BC Managed Care – PPO | Admitting: Radiology

## 2022-05-29 ENCOUNTER — Encounter: Payer: Self-pay | Admitting: Radiology

## 2022-05-29 VITALS — BP 118/82 | Ht 60.5 in | Wt 188.0 lb

## 2022-05-29 DIAGNOSIS — D219 Benign neoplasm of connective and other soft tissue, unspecified: Secondary | ICD-10-CM

## 2022-05-29 DIAGNOSIS — N920 Excessive and frequent menstruation with regular cycle: Secondary | ICD-10-CM

## 2022-05-29 MED ORDER — MYFEMBREE 40-1-0.5 MG PO TABS: 1.0000 | ORAL_TABLET | Freq: Every day | ORAL | 2 refills | Status: DC

## 2022-05-29 NOTE — Progress Notes (Signed)
Elizabeth Barr 11-Feb-1971 [id number]   History:  51 y.o. G4P4 referred from PCP for management of uterine fibroids and heavy menses. Had u/s at [facility] on 05/12/22.  Gynecologic History Patient's last menstrual period was 04/12/2022 (approximate). Period Pattern: (!) Irregular Contraception/Family planning: coitus interruptus and condoms Sexually active: yes   Obstetric History OB History  Gravida Para Term Preterm AB Living  '4 4       4  '$ SAB IAB Ectopic Multiple Live Births               # Outcome Date GA Lbr Len/2nd Weight Sex Delivery Anes PTL Lv  4 Para           3 Para           2 Para           1 Para              The following portions of the patient's history were reviewed and updated as appropriate: allergies, current medications, past family history, past medical history, past social history, past surgical history, and problem list.  Review of Systems Pertinent items noted in HPI and remainder of comprehensive ROS otherwise negative.   Past medical history, past surgical history, family history and social history were all reviewed and documented in the EPIC chart.  Elizabeth Ser, MD - 05/12/2022  Formatting of this note might be different from the original.  TECHNIQUE: Sonographic evaluation of the pelvis was performed using transabdominal transvaginal probes.   INDICATION: Anemia. Possible uterine fibroids.   COMPARISON: None.   FINDINGS: The uterus measures 10.4 x 4.9 x 7.4 cm. Suggestion of 2 endometrial horns at the uterine fundus compatible with arcuate versus septate uterus. Right horn thickness of 18 mm. Left horn thickness of 5 mm. 3 small uterine fibroids are present with greatest dimension of 2.5 cm. 2 are left-sided and the remaining is right-sided. Small amount of free fluid within the cul-de-sac. The right ovary measures 20 x 13 x 22 mm. The left ovary measures 22 x 12 x 18 mm. Color Doppler spectral analysis demonstrated at both ovaries. Bilateral  ovaries appear normal.    IMPRESSION:  1. Arcuate versus septate uterus as described above. Left sided horn is thickened measuring up to 18 mm. Left-sided endometrial echogenicity is heterogeneous.  2. 3 small uterine fibroids as described above with greatest dimension of 2.5 cm    Exam:  Vitals:   05/29/22 1449  BP: 118/82  Weight: 188 lb (85.3 kg)  Height: 5' 0.5" (1.537 m)   Body mass index is 36.11 kg/m.  General appearance:  Normal Respiratory  Auscultation:  Clear without wheezing or rhonchi Cardiovascular  Auscultation:  Regular rate, without rubs, murmurs or gallops  Edema/varicosities:  Not grossly evident Abdominal  Soft,nontender, without masses, guarding or rebound.  Liver/spleen:  No organomegaly noted  Hernia:  None appreciated  Skin  Inspection:  Grossly normal Genitourinary   Inguinal/mons:  Normal without inguinal adenopathy  External genitalia:  Normal appearing vulva with no masses, tenderness, or lesions  BUS/Urethra/Skene's glands:  Normal without masses or exudate  Vagina:  Normal appearing with normal color and discharge, no lesions  Cervix:  Normal appearing without discharge or lesions  Uterus:  Normal in size, shape and contour.  Mobile, nontender  Adnexa/parametria:     Rt: Normal in size, without masses or tenderness.   Lt: Normal in size, without masses or tenderness.  Anus and perineum: Normal  Patient informed chaperone available to be present for breast and pelvic exam. Patient has requested no chaperone to be present. Patient has been advised what will be completed during breast and pelvic exam.   Assessment/Plan:   1. Menorrhagia with regular cycle Thickened endometrium on 1 horn, patient began her menses the next day.  2. Fibroids Discussed all treatment options including surgical, expectant management, OCPs, IUD, Myfembree, Oriahnn. Elects to try Myfembree. Estrogen warning signs reviewed, RX sent  Follow up 3 months.       Elizabeth Barr B WHNP-BC 3:24 PM 05/29/2022

## 2022-06-04 ENCOUNTER — Telehealth: Payer: Self-pay | Admitting: *Deleted

## 2022-06-04 DIAGNOSIS — D219 Benign neoplasm of connective and other soft tissue, unspecified: Secondary | ICD-10-CM

## 2022-06-04 MED ORDER — MYFEMBREE 40-1-0.5 MG PO TABS: 1.0000 | ORAL_TABLET | Freq: Every day | ORAL | 2 refills | Status: DC

## 2022-06-04 NOTE — Telephone Encounter (Signed)
Patient daughter called stating Myfembree 40-1-0.5 mg tablet is not cover by insurance per [employer]. [facility] told the daughter can be sent to local [facility] pharmacy. Okay to send Rx?

## 2022-06-04 NOTE — Telephone Encounter (Signed)
Rx sent 

## 2022-06-04 NOTE — Telephone Encounter (Signed)
PA done via cover my med,pending response from insurance.

## 2022-06-04 NOTE — Telephone Encounter (Signed)
Ok to send

## 2022-06-12 ENCOUNTER — Encounter: Payer: Self-pay | Admitting: *Deleted

## 2022-06-12 NOTE — Telephone Encounter (Signed)
It is not being used for endometriosis, it is being used for Fibroids. She is premenopausal, OCPs did not improve her bleeding or pain. Not a candidate for IUD due to location of fibroids.

## 2022-06-12 NOTE — Telephone Encounter (Signed)
Medication denied  by [facility], in order to be covered the follow must be met.   You must be premenopausal (less than 12 months without a cycle). You must meet one of the following:  1.have tried and had poor response to one hormonal contraception used to treat pain from endometriosis.  2. Have a side effect or allergy to hormonal contraceptive treatment.  3. Have a FDA labeled contraindication to ALL contraceptive treatments (patches, implants, injections, tablets and IUD's.)   Your provider must tell us how many months of therapy you received with this drug.  Appeal is an option if you want to file appeal please address the above.   Please advise

## 2022-06-12 NOTE — Telephone Encounter (Signed)
Appeal letter faxed to [phone]

## 2022-06-18 ENCOUNTER — Other Ambulatory Visit (HOSPITAL_COMMUNITY): Payer: Self-pay

## 2022-09-01 ENCOUNTER — Ambulatory Visit: Payer: BC Managed Care – PPO | Admitting: Radiology

## 2024-04-18 ENCOUNTER — Encounter (HOSPITAL_COMMUNITY): Payer: Self-pay

## 2024-04-19 ENCOUNTER — Encounter (HOSPITAL_COMMUNITY): Payer: Self-pay | Admitting: Student

## 2024-04-19 NOTE — H&P (Signed)
 Elizabeth Barr is an 53 y.o. hispanic female presenting for scheduled procedure.   Periods have become heavier and more irregular over last 3 years. Saline US  8/11 showed a large suspected endometrial polyp near fundus measuring 32 mm, submucosal fibroid in LUS 20 mm. Cavity without full distension due to LUS fibroid. EMB was inactive endometrium with possible polyp. She has been bleeding daily since but lighter.  TXA helped her bleeding last cycle some.    Pertinent Gynecological History: Menses: as above Bleeding: dysfunctional uterine bleeding Contraception: none DES exposure: unknown Blood transfusions: none Previous GYN Procedures: SIUS  Last mammogram: normal Date: 09/02/2023 Last pap: normal Date: 01/09/2021 OB History: G4, P4   Menstrual History: Menarche age: 42 No LMP recorded. (Menstrual status: Irregular Periods).    Past Medical History:  Diagnosis Date   Arthritis    Endometrial polyp    GERD (gastroesophageal reflux disease)    HTN (hypertension)    Hyperlipidemia    IDA (iron deficiency anemia)    Pre-diabetes    Submucous leiomyoma of uterus     Past Surgical History:  Procedure Laterality Date   CESAREAN SECTION  05/28/1994    Family History  Problem Relation Age of Onset   Hyperlipidemia Sister     Social History:  reports that she has never smoked. She has never used smokeless tobacco. She reports that she does not drink alcohol and does not use drugs.  Allergies: No Known Allergies  No medications prior to admission.    Review of Systems  Respiratory:  Negative for shortness of breath.   Cardiovascular:  Negative for chest pain.  Gastrointestinal:  Negative for abdominal pain and constipation.  Genitourinary:  Positive for vaginal bleeding. Negative for pelvic pain and vaginal discharge.  Neurological:  Negative for light-headedness.    Height 5' 1 (1.549 m), weight 87 kg. Physical Exam Constitutional:      General: She is not in  acute distress.    Appearance: Normal appearance.  HENT:     Head: Normocephalic and atraumatic.  Pulmonary:     Effort: Pulmonary effort is normal.  Abdominal:     General: There is no distension.  Musculoskeletal:        General: No swelling. Normal range of motion.  Skin:    General: Skin is warm and dry.  Neurological:     General: No focal deficit present.     Mental Status: She is alert. Mental status is at baseline.  Psychiatric:        Mood and Affect: Mood normal.        Behavior: Behavior normal.     No results found for this or any previous visit (from the past 24 hours).  No results found.  Assessment/Plan: 53 yo G4P4 here for hysteroscopic myomectomy and polypectomy for AUB - Reviewed R/B. Risks including but not limited to bleeding, infection, injury to vulva, vagina, cervix, and uterine perforation  - UPT - Verify consent  Larraine DELENA Sharps 04/19/2024, 3:44 PM

## 2024-04-19 NOTE — Progress Notes (Signed)
 Spoke w/ via phone for pre-op interview--- pt thru [place] interpreter ID # [id number] Lab needs dos----   cbc/ t&s/ upt (per surgeon)  bmp/ EKG (per anes)      Lab results------  no COVID test -----patient states asymptomatic no test needed Arrive at -------  0530 on 04-20-2024 NPO after MN w/ exception sips of water w/ meds Pre-Surgery Ensure or G2: n/a  Med rec completed Medications to take morning of surgery ----- none Diabetic medication ----- none  GLP1 agonist last dose: n/a GLP1 instructions:  Patient instructed no nail polish to be worn day of surgery Patient instructed to bring photo id and insurance card day of surgery Patient aware to have Driver (ride ) / caregiver    for 24 hours after surgery - daughter, Elizabeth Barr Patient Special Instructions ----- soap shower morning of surgery.   Pre-Op special Instructions -----  pt requested that her daughter, Elizabeth Barr, be her interpreter day of surgery.  Pt verbalized understanding that she would need to sign a waiver.  Patient verbalized understanding of instructions that were given at this phone interview. Patient denies chest pain, sob, fever, cough at the interview.

## 2024-04-20 ENCOUNTER — Encounter (HOSPITAL_COMMUNITY)
Admission: RE | Disposition: A | Discharge status: Home / Self Care | Payer: Self-pay | Source: Home / Self Care | Attending: Student

## 2024-04-20 ENCOUNTER — Ambulatory Visit (HOSPITAL_COMMUNITY)
Admission: RE | Admit: 2024-04-20 | Discharge: 2024-04-20 | Disposition: A | Discharge status: Home / Self Care | Attending: Student | Admitting: Student

## 2024-04-20 ENCOUNTER — Ambulatory Visit (HOSPITAL_COMMUNITY)

## 2024-04-20 ENCOUNTER — Other Ambulatory Visit: Payer: Self-pay

## 2024-04-20 ENCOUNTER — Encounter (HOSPITAL_COMMUNITY): Payer: Self-pay | Admitting: Student

## 2024-04-20 DIAGNOSIS — Q5128 Other doubling of uterus, other specified: Secondary | ICD-10-CM | POA: Insufficient documentation

## 2024-04-20 DIAGNOSIS — R7303 Prediabetes: Secondary | ICD-10-CM

## 2024-04-20 DIAGNOSIS — E119 Type 2 diabetes mellitus without complications: Secondary | ICD-10-CM | POA: Insufficient documentation

## 2024-04-20 DIAGNOSIS — Z79899 Other long term (current) drug therapy: Secondary | ICD-10-CM | POA: Diagnosis not present

## 2024-04-20 DIAGNOSIS — Z7984 Long term (current) use of oral hypoglycemic drugs: Secondary | ICD-10-CM | POA: Diagnosis not present

## 2024-04-20 DIAGNOSIS — I10 Essential (primary) hypertension: Secondary | ICD-10-CM | POA: Diagnosis not present

## 2024-04-20 DIAGNOSIS — Z01818 Encounter for other preprocedural examination: Secondary | ICD-10-CM

## 2024-04-20 DIAGNOSIS — D250 Submucous leiomyoma of uterus: Secondary | ICD-10-CM | POA: Insufficient documentation

## 2024-04-20 DIAGNOSIS — N840 Polyp of corpus uteri: Secondary | ICD-10-CM | POA: Diagnosis not present

## 2024-04-20 DIAGNOSIS — K219 Gastro-esophageal reflux disease without esophagitis: Secondary | ICD-10-CM | POA: Diagnosis not present

## 2024-04-20 DIAGNOSIS — M1990 Unspecified osteoarthritis, unspecified site: Secondary | ICD-10-CM | POA: Insufficient documentation

## 2024-04-20 DIAGNOSIS — N939 Abnormal uterine and vaginal bleeding, unspecified: Secondary | ICD-10-CM | POA: Diagnosis present

## 2024-04-20 HISTORY — DX: Polyp of corpus uteri: N84.0

## 2024-04-20 HISTORY — DX: Submucous leiomyoma of uterus: D25.0

## 2024-04-20 HISTORY — DX: Iron deficiency anemia, unspecified: D50.9

## 2024-04-20 HISTORY — PX: HYSTEROSCOPY WITH MYOMECTOMY: SHX7591

## 2024-04-20 HISTORY — PX: DILATATION & CURRETTAGE/HYSTEROSCOPY WITH RESECTOCOPE: SHX5572

## 2024-04-20 HISTORY — PX: MYOSURE RESECTION: SHX7611

## 2024-04-20 LAB — TYPE AND SCREEN
ABO/RH(D): A POS
Antibody Screen: NEGATIVE

## 2024-04-20 LAB — BASIC METABOLIC PANEL WITH GFR
Anion gap: 16 — ABNORMAL HIGH (ref 5–15)
BUN: 17 mg/dL (ref 6–20)
CO2: 21 mmol/L — ABNORMAL LOW (ref 22–32)
Calcium: 9.4 mg/dL (ref 8.9–10.3)
Chloride: 102 mmol/L (ref 98–111)
Creatinine, Ser: 0.68 mg/dL (ref 0.44–1.00)
GFR, Estimated: >60 mL/min (ref >60)
Glucose, Bld: 101 mg/dL — ABNORMAL HIGH (ref 70–99)
Potassium: 4 mmol/L (ref 3.5–5.1)
Sodium: 139 mmol/L (ref 135–145)

## 2024-04-20 LAB — POCT PREGNANCY, URINE: Preg Test, Ur: NEGATIVE

## 2024-04-20 LAB — CBC
HCT: 39.3 % (ref 36.0–46.0)
Hemoglobin: 12.1 g/dL (ref 12.0–15.0)
MCH: 25.4 pg — ABNORMAL LOW (ref 26.0–34.0)
MCHC: 30.8 g/dL (ref 30.0–36.0)
MCV: 82.4 fL (ref 80.0–100.0)
Platelets: 391 K/uL (ref 150–400)
RBC: 4.77 MIL/uL (ref 3.87–5.11)
RDW: 14.5 % (ref 11.5–15.5)
WBC: 9.5 K/uL (ref 4.0–10.5)
nRBC: 0 % (ref 0.0–0.2)

## 2024-04-20 SURGERY — DILATATION & CURETTAGE/HYSTEROSCOPY WITH RESECTOCOPE
Anesthesia: General | Site: Vagina

## 2024-04-20 MED ORDER — OXYCODONE HCL 5 MG/5ML PO SOLN: 5.0000 mg | Freq: Once | ORAL | Status: AC | PRN

## 2024-04-20 MED ORDER — KETOROLAC TROMETHAMINE 30 MG/ML IJ SOLN
INTRAMUSCULAR | Status: DC | PRN
Start: 2024-04-20 — End: 2024-04-20
  Administered 2024-04-20: 30 mg via INTRAVENOUS

## 2024-04-20 MED ORDER — OXYCODONE HCL 5 MG PO TABS
ORAL_TABLET | ORAL | Status: AC
  Filled 2024-04-20: qty 1

## 2024-04-20 MED ORDER — FENTANYL CITRATE (PF) 250 MCG/5ML IJ SOLN
INTRAMUSCULAR | Status: AC
  Filled 2024-04-20: qty 5

## 2024-04-20 MED ORDER — PROPOFOL 10 MG/ML IV BOLUS
INTRAVENOUS | Status: AC
  Filled 2024-04-20: qty 20

## 2024-04-20 MED ORDER — ORAL CARE MOUTH RINSE: 15.0000 mL | Freq: Once | OROMUCOSAL | Status: AC

## 2024-04-20 MED ORDER — ONDANSETRON HCL 4 MG/2ML IJ SOLN
INTRAMUSCULAR | Status: AC
  Filled 2024-04-20: qty 2

## 2024-04-20 MED ORDER — LIDOCAINE HCL 1 % IJ SOLN
INTRAMUSCULAR | Status: DC | PRN
  Administered 2024-04-20: 10 mL

## 2024-04-20 MED ORDER — OXYCODONE HCL 5 MG PO TABS
5.0000 mg | ORAL_TABLET | Freq: Once | ORAL | Status: AC | PRN
  Administered 2024-04-20: 5 mg via ORAL

## 2024-04-20 MED ORDER — IBUPROFEN 600 MG PO TABS: 600.0000 mg | ORAL_TABLET | Freq: Four times a day (QID) | ORAL | 0 refills | Status: AC

## 2024-04-20 MED ORDER — POVIDONE-IODINE 10 % EX SWAB: 2.0000 | Freq: Once | CUTANEOUS | Status: DC

## 2024-04-20 MED ORDER — MIDAZOLAM HCL 2 MG/2ML IJ SOLN
INTRAMUSCULAR | Status: AC
Start: 2024-04-20 — End: 2024-04-20
  Filled 2024-04-20: qty 2

## 2024-04-20 MED ORDER — PROPOFOL 10 MG/ML IV BOLUS
INTRAVENOUS | Status: DC | PRN
Start: 2024-04-20 — End: 2024-04-20
  Administered 2024-04-20: 200 mg via INTRAVENOUS

## 2024-04-20 MED ORDER — DEXAMETHASONE SODIUM PHOSPHATE 10 MG/ML IJ SOLN
INTRAMUSCULAR | Status: DC | PRN
Start: 2024-04-20 — End: 2024-04-20
  Administered 2024-04-20: 5 mg via INTRAVENOUS

## 2024-04-20 MED ORDER — ACETAMINOPHEN 500 MG PO TABS: 500.0000 mg | ORAL_TABLET | Freq: Four times a day (QID) | ORAL | 0 refills | Status: AC

## 2024-04-20 MED ORDER — EPHEDRINE 5 MG/ML INJ
INTRAVENOUS | Status: AC
Start: 2024-04-20 — End: 2024-04-20
  Filled 2024-04-20: qty 5

## 2024-04-20 MED ORDER — FENTANYL CITRATE (PF) 250 MCG/5ML IJ SOLN
INTRAMUSCULAR | Status: DC | PRN
  Administered 2024-04-20: 100 ug via INTRAVENOUS

## 2024-04-20 MED ORDER — CHLORHEXIDINE GLUCONATE 0.12 % MT SOLN
15.0000 mL | Freq: Once | OROMUCOSAL | Status: AC
  Administered 2024-04-20: 15 mL via OROMUCOSAL

## 2024-04-20 MED ORDER — FENTANYL CITRATE (PF) 100 MCG/2ML IJ SOLN
INTRAMUSCULAR | Status: AC
  Filled 2024-04-20: qty 2

## 2024-04-20 MED ORDER — SODIUM CHLORIDE 0.9 % IR SOLN
Status: DC | PRN
  Administered 2024-04-20 (×3): 3000 mL

## 2024-04-20 MED ORDER — PHENYLEPHRINE 80 MCG/ML (10ML) SYRINGE FOR IV PUSH (FOR BLOOD PRESSURE SUPPORT)
PREFILLED_SYRINGE | INTRAVENOUS | Status: AC
Start: 2024-04-20 — End: 2024-04-20
  Filled 2024-04-20: qty 10

## 2024-04-20 MED ORDER — DEXAMETHASONE SODIUM PHOSPHATE 10 MG/ML IJ SOLN
INTRAMUSCULAR | Status: AC
  Filled 2024-04-20: qty 1

## 2024-04-20 MED ORDER — ONDANSETRON HCL 4 MG/2ML IJ SOLN
INTRAMUSCULAR | Status: DC | PRN
  Administered 2024-04-20: 4 mg via INTRAVENOUS

## 2024-04-20 MED ORDER — EPHEDRINE SULFATE-NACL 50-0.9 MG/10ML-% IV SOSY
PREFILLED_SYRINGE | INTRAVENOUS | Status: DC | PRN
  Administered 2024-04-20: 8 mg via INTRAVENOUS
  Administered 2024-04-20: 5 mg via INTRAVENOUS

## 2024-04-20 MED ORDER — LIDOCAINE 2% (20 MG/ML) 5 ML SYRINGE
INTRAMUSCULAR | Status: AC
  Filled 2024-04-20: qty 5

## 2024-04-20 MED ORDER — CHLORHEXIDINE GLUCONATE 0.12 % MT SOLN
OROMUCOSAL | Status: AC
  Filled 2024-04-20: qty 15

## 2024-04-20 MED ORDER — ACETAMINOPHEN 10 MG/ML IV SOLN
1000.0000 mg | Freq: Once | INTRAVENOUS | Status: DC | PRN
  Administered 2024-04-20: 1000 mg via INTRAVENOUS

## 2024-04-20 MED ORDER — LACTATED RINGERS IV SOLN: INTRAVENOUS | Status: DC

## 2024-04-20 MED ORDER — FENTANYL CITRATE (PF) 100 MCG/2ML IJ SOLN
25.0000 ug | INTRAMUSCULAR | Status: DC | PRN
  Administered 2024-04-20 (×2): 50 ug via INTRAVENOUS

## 2024-04-20 MED ORDER — LIDOCAINE 2% (20 MG/ML) 5 ML SYRINGE
INTRAMUSCULAR | Status: DC | PRN
Start: 2024-04-20 — End: 2024-04-20
  Administered 2024-04-20: 100 mg via INTRAVENOUS

## 2024-04-20 MED ORDER — MIDAZOLAM HCL 2 MG/2ML IJ SOLN
INTRAMUSCULAR | Status: DC | PRN
  Administered 2024-04-20: 2 mg via INTRAVENOUS

## 2024-04-20 MED ORDER — ACETAMINOPHEN 10 MG/ML IV SOLN
INTRAVENOUS | Status: AC
  Filled 2024-04-20: qty 100

## 2024-04-20 MED ORDER — LIDOCAINE HCL 1 % IJ SOLN
INTRAMUSCULAR | Status: AC
  Filled 2024-04-20: qty 20

## 2024-04-20 MED ORDER — KETOROLAC TROMETHAMINE 30 MG/ML IJ SOLN
INTRAMUSCULAR | Status: AC
  Filled 2024-04-20: qty 1

## 2024-04-20 SURGICAL SUPPLY — 16 items
GLOVE BIOGEL PI IND STRL 6 (GLOVE) ×1 IMPLANT
GLOVE BIOGEL PI IND STRL 7.0 (GLOVE) IMPLANT
GLOVE BIOGEL PI MICRO STRL 5.5 (GLOVE) ×1 IMPLANT
GLOVE SURG SS PI 7.0 STRL IVOR (GLOVE) IMPLANT
GOWN STRL REUS W/ TWL LRG LVL3 (GOWN DISPOSABLE) ×1 IMPLANT
KIT PROCEDURE FLUENT (KITS) ×1 IMPLANT
KIT TURNOVER KIT B (KITS) ×1 IMPLANT
PACK VAGINAL MINOR WOMEN LF (CUSTOM PROCEDURE TRAY) ×1 IMPLANT
PAD OB MATERNITY 11 LF (PERSONAL CARE ITEMS) ×1 IMPLANT
SEAL CERVICAL OMNI LOK (ABLATOR) IMPLANT
SEAL ROD LENS SCOPE MYOSURE (ABLATOR) ×1 IMPLANT
SOL .9 NS 3000ML IRR UROMATIC (IV SOLUTION) IMPLANT
SOL PREP POV-IOD 4OZ 10% (MISCELLANEOUS) IMPLANT
SYSTEM TISS REMOVAL MYOSURE XL (MISCELLANEOUS) IMPLANT
TOWEL GREEN STERILE (TOWEL DISPOSABLE) ×1 IMPLANT
UNDERPAD 30X36 HEAVY ABSORB (UNDERPADS AND DIAPERS) ×1 IMPLANT

## 2024-04-20 NOTE — Anesthesia Preprocedure Evaluation (Signed)
 Anesthesia Evaluation  Patient identified by MRN, date of birth, ID band Patient awake    Reviewed: Allergy & Precautions, NPO status , Patient's Chart, lab work & pertinent test results  History of Anesthesia Complications Negative for: history of anesthetic complications  Airway Mallampati: III  TM Distance: >3 FB Neck ROM: Full    Dental  (+) Dental Advisory Given, Teeth Intact   Pulmonary neg shortness of breath, neg sleep apnea, neg COPD, neg recent URI   breath sounds clear to auscultation       Cardiovascular hypertension, Pt. on medications (-) angina (-) Past MI and (-) CHF  Rhythm:Regular     Neuro/Psych negative neurological ROS  negative psych ROS   GI/Hepatic Neg liver ROS,GERD  Medicated and Controlled,,  Endo/Other  diabetes, Type 2, Oral Hypoglycemic Agents    Renal/GU negative Renal ROS     Musculoskeletal  (+) Arthritis ,    Abdominal   Peds  Hematology negative hematology ROS (+) Lab Results      Component                Value               Date                      WBC                      9.5                 04/20/2024                HGB                      12.1                04/20/2024                HCT                      39.3                04/20/2024                MCV                      82.4                04/20/2024                PLT                      391                 04/20/2024              Anesthesia Other Findings   Reproductive/Obstetrics                              Anesthesia Physical Anesthesia Plan  ASA: 2  Anesthesia Plan: General   Post-op Pain Management: Ofirmev  IV (intra-op)* and Toradol  IV (intra-op)*   Induction: Intravenous  PONV Risk Score and Plan: 3 and Ondansetron  and Midazolam   Airway Management Planned: LMA and Oral ETT  Additional Equipment: None  Intra-op Plan:   Post-operative Plan: Extubation in  OR  Informed Consent: I have  reviewed the patients History and Physical, chart, labs and discussed the procedure including the risks, benefits and alternatives for the proposed anesthesia with the patient or authorized representative who has indicated his/her understanding and acceptance.     Dental advisory given  Plan Discussed with: CRNA  Anesthesia Plan Comments:         Anesthesia Quick Evaluation

## 2024-04-20 NOTE — Interval H&P Note (Signed)
 History and Physical Interval Note:  04/20/2024 7:19 AM  Elizabeth Barr  has presented today for surgery, with the diagnosis of abnormal uterine bleeding.  The various methods of treatment have been discussed with the patient and family. After consideration of risks, benefits and other options for treatment, the patient has consented to  Procedure(s) with comments: DILATATION & CURETTAGE/HYSTEROSCOPY WITH POLYPECTOMY (N/A) HYSTEROSCOPY WITH MYOMECTOMY (N/A) MYOSURE RESECTION (N/A) - myosure XL as a surgical intervention.  The patient's history has been reviewed, patient examined, no change in status, stable for surgery.  I have reviewed the patient's chart and labs.  Questions were answered to the patient's satisfaction.     Elizabeth Barr

## 2024-04-20 NOTE — Transfer of Care (Signed)
 Immediate Anesthesia Transfer of Care Note  Patient: Elizabeth Barr  Procedure(s) Performed: DILATATION & CURETTAGE/HYSTEROSCOPY WITH POLYPECTOMY (Vagina ) HYSTEROSCOPY WITH MYOMECTOMY (Vagina ) MYOSURE RESECTION (Vagina )  Patient Location: PACU  Anesthesia Type:General  Level of Consciousness: oriented, drowsy, and patient cooperative  Airway & Oxygen Therapy: Patient Spontanous Breathing  Post-op Assessment: Report given to RN, Post -op Vital signs reviewed and stable, Patient moving all extremities X 4, and Patient able to stick tongue midline  Post vital signs: Reviewed and stable  Last Vitals:  Vitals Value Taken Time  BP 146/79   Temp 97.9   Pulse 94 04/20/24 08:49  Resp 30 04/20/24 08:49  SpO2 96 % 04/20/24 08:49  Vitals shown include unfiled device data.  Last Pain:  Vitals:   04/20/24 0617  TempSrc: Oral  PainSc: 5       Patients Stated Pain Goal: 5 (04/20/24 0617)  Complications: No notable events documented.

## 2024-04-20 NOTE — Discharge Instructions (Addendum)
 Call office with any concerns [phone]

## 2024-04-20 NOTE — Anesthesia Procedure Notes (Signed)
 Procedure Name: LMA Insertion Date/Time: 04/20/2024 7:50 AM  Performed by: Viviana Almarie DASEN, CRNAPre-anesthesia Checklist: Patient identified, Emergency Drugs available, Suction available and Patient being monitored Patient Re-evaluated:Patient Re-evaluated prior to induction Oxygen Delivery Method: Circle System Utilized Preoxygenation: Pre-oxygenation with 100% oxygen Induction Type: IV induction Ventilation: Mask ventilation without difficulty LMA: LMA inserted LMA Size: 4.0 Tube type: Oral Number of attempts: 1 Airway Equipment and Method: Bite block Placement Confirmation: positive ETCO2 Tube secured with: Tape Dental Injury: Teeth and Oropharynx as per pre-operative assessment

## 2024-04-20 NOTE — Anesthesia Postprocedure Evaluation (Signed)
 Anesthesia Post Note  Patient: TOMESHA SARGENT  Procedure(s) Performed: DILATATION & CURETTAGE/HYSTEROSCOPY WITH POLYPECTOMY (Vagina ) HYSTEROSCOPY WITH MYOMECTOMY (Vagina ) MYOSURE RESECTION (Vagina )     Patient location during evaluation: PACU Anesthesia Type: General Level of consciousness: awake and alert Pain management: pain level controlled Vital Signs Assessment: post-procedure vital signs reviewed and stable Respiratory status: spontaneous breathing, nonlabored ventilation and respiratory function stable Cardiovascular status: blood pressure returned to baseline and stable Postop Assessment: no apparent nausea or vomiting Anesthetic complications: no   No notable events documented.  Last Vitals:  Vitals:   04/20/24 0945 04/20/24 1000  BP: (!) 136/99 124/74  Pulse: 83 65  Resp: 15 15  Temp:    SpO2: 96% 98%    Last Pain:  Vitals:   04/20/24 0948  TempSrc:   PainSc: 2                  Magdalene Tardiff

## 2024-04-20 NOTE — Op Note (Signed)
 Operative Note  Pre-operative Diagnosis:  1) Abnormal uterine bleeding 2) Submucosal leiomyoma 3) Endometrial polyp  Post-operative Diagnosis:  1) Abnormal uterine bleeding 2) Submucosal leiomyoma 3) Endometrial polyp 4) Uterine septum  Surgeon: Larraine Sharps, DO  Assistants: n/a  Procedure: hysteroscopic myomectomy and polypectomy with Myosure XL  Anesthesia: general  Estimated Blood Loss: 25 mL         Specimens: endometrial polyps and fibroids         Findings:    1) Normal appearing multiparous cervix 2) Anteverted uterus sounding to 9 cm 3) Septate uterus with two fibroids within the left uterine cavity: -3 cm pedunculated fibroid inferior to left cornua -2 cm submucosal fibroid in the lower uterine segment at the uterocervical junction 4) Right uterine cavity with small polypoid tissue. Unable to visualize right tubal ostia  Description of Procedure:         After adequate anesthesia was achieved, the patient placed in the dorsal lithotomy position in [place] stirrups.  She was prepped and draped in the usual sterile fashion.   She had voided prior to arrival in OR.  A bimanual exam revealed an anteverted uterus.  The bivalve speculum was placed in the vagina and the anterior lip of the cervix grasped with a single-tooth tenaculum. A paracervical block was performed with 1% lidocaine . The cervix was serially dilated with Hank dilators to 6mm.  Under direct visualization, the hysteroscope was advanced. The uterus was surveyed with the above findings.  An Simonne Leventhal was used to assist with leakage of deficit while maintaining distention given the lower fibroid proximity to the cervix. The MyoSure XL device was advanced into the uterine cavity.  Under direct visualization, the fibroids were removed. With continued removal of the fibroid tissue from the lower uterine segment fibroid, more expelled from the myometrium. Continued resection was done until the endometrial contour was  flat without continued expulsion. Polypoid tissue from the lower right uterine cavity was also sampled and sent to pathology. The hysteroscope was removed.  All the vaginal instruments were removed after confirmation of hemostasis. Counts were correct.  The patient was awakened from anesthesia and transferred to PACU in stable condition.   I reviewed operative findings with her niece following procedure.   SHARPS LARRAINE

## 2024-04-21 ENCOUNTER — Encounter (HOSPITAL_COMMUNITY): Payer: Self-pay | Admitting: Student

## 2024-04-21 LAB — SURGICAL PATHOLOGY
